# Patient Record
Sex: Female | Born: 1990 | Race: Black or African American | Hispanic: No | Marital: Single | Smoking: Never smoker
Health system: Southern US, Community
[De-identification: ages and names within clinical notes are randomized; demographics above are authoritative.]

## PROBLEM LIST (undated history)

## (undated) DIAGNOSIS — E669 Obesity, unspecified: Secondary | ICD-10-CM

## (undated) DIAGNOSIS — K219 Gastro-esophageal reflux disease without esophagitis: Secondary | ICD-10-CM

## (undated) DIAGNOSIS — S43011A Anterior subluxation of right humerus, initial encounter: Secondary | ICD-10-CM

## (undated) DIAGNOSIS — I10 Essential (primary) hypertension: Secondary | ICD-10-CM

## (undated) HISTORY — DX: Gastro-esophageal reflux disease without esophagitis: K21.9

## (undated) HISTORY — DX: Anterior subluxation of right humerus, initial encounter: S43.011A

---
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
## 2012-06-14 ENCOUNTER — Ambulatory Visit (INDEPENDENT_AMBULATORY_CARE_PROVIDER_SITE_OTHER): Payer: Medicaid Other | Admitting: Family Medicine

## 2012-06-14 ENCOUNTER — Ambulatory Visit (HOSPITAL_COMMUNITY)
Admission: RE | Admit: 2012-06-14 | Discharge: 2012-06-14 | Disposition: A | Discharge status: Home / Self Care | Payer: Medicaid Other | Source: Ambulatory Visit | Attending: Family Medicine | Admitting: Family Medicine

## 2012-06-14 ENCOUNTER — Encounter: Payer: Self-pay | Admitting: Family Medicine

## 2012-06-14 VITALS — BP 149/83 | HR 102 | Temp 98.3°F | Ht 60.5 in | Wt 341.0 lb

## 2012-06-14 DIAGNOSIS — K219 Gastro-esophageal reflux disease without esophagitis: Secondary | ICD-10-CM

## 2012-06-14 DIAGNOSIS — R079 Chest pain, unspecified: Secondary | ICD-10-CM | POA: Insufficient documentation

## 2012-06-14 HISTORY — DX: Gastro-esophageal reflux disease without esophagitis: K21.9

## 2012-06-14 MED ORDER — OMEPRAZOLE 40 MG PO CPDR: 40.0000 mg | DELAYED_RELEASE_CAPSULE | Freq: Every day | ORAL | Status: DC

## 2012-06-14 NOTE — Progress Notes (Signed)
Paula Drake is a 22 y.o. female who presents to [facility] today for CP  CP: started one week ago. Feels like someone is standing on chest. Worse at night. Worse after doing lifting weights primarily butterflies w/ 10lb in each. Started lifting 2 weeks ago. Sleeps on Side. Denies n/v palpitaitons, HA, syncope. Has not taken anything for pain.   GERD: Trying pepcid and maalox w/o benefit. Cut out greasy foods w/o benefit. Typically after every meal and when laying down. Denies any hematemesis or melena.    The following portions of the patient's history were reviewed and updated as appropriate: allergies, current medications,  social history, and problem list.  Patient is a nonsmoker  Past Medical History  Diagnosis Date  . Obesity     ROS as above otherwise neg.    Medications reviewed. Current Outpatient Prescriptions  Medication Sig Dispense Refill  . alum & mag hydroxide-simeth (MAALOX/MYLANTA) 200-200-20 MG/5 SUSP 30 cc every 8 hours as needed  360 mL  0  . Aspirin-Salicylamide-Caffeine (BC HEADACHE PO) Take 1 packet by mouth daily as needed. For headache      . famotidine (PEPCID) 20 MG tablet Take 1 tablet (20 mg total) by mouth 2 (two) times daily.  30 tablet  0  . magnesium hydroxide (MILK OF MAGNESIA) 400 MG/5ML suspension Take 15 mLs by mouth every 4 (four) hours.       No current facility-administered medications for this visit.    Exam: BP 149/83  Pulse 102  Temp(Src) 98.3 F (36.8 C) (Oral)  Ht 5' 0.5" (1.537 m)  Wt 341 lb (154.677 kg)  BMI 65.48 kg/m2  LMP 06/01/2012 Gen: Well NAD, obese HEENT: EOMI,  MMM Lungs: CTABL Nl WOB Heart: RRR no MRG Abd: NABS, NT, ND MSK: reproducible superficial pain on palpation of chest wall  No results found for this or any previous visit (from the past 72 hour(s)).

## 2012-06-14 NOTE — Assessment & Plan Note (Addendum)
Most liekly MSK pain (costochondritis) likely compounded w/ GERD. Unlikely ischemic Ibuprofen 600mg  Q6 (this may worsen reflux so pt to use for the next couple of days and and discontinue if reflux worsens) Prilosec 40 Qday x14 days Stop butterfly exercises for 2 wks

## 2012-06-14 NOTE — Patient Instructions (Addendum)
Your chest pain is from costochondritis or inflammation of the bones and cartiledge of the chest/sternum.  Stop doing your butterfly exercises with the weights for at least 2 weeks Please start taking ibuprofen 600mg  every 6 hours as tolerated for the next several days. THis may make your heartburn worse but will help with your chest pain. If this upsets your stomach then stop Please start taking Prilosec 40mg  every day for 2 weeks. This will help with your heartburn.  Continue to work on cutting fried and spicy foods out of your diet.  Have a great day  Costochondritis Costochondritis (Tietze syndrome), or costochondral separation, is a swelling and irritation (inflammation) of the tissue (cartilage) that connects your ribs with your breastbone (sternum). It may occur on its own (spontaneously), through damage caused by an accident (trauma), or simply from coughing or minor exercise. It may take up to 6 weeks to get better and longer if you are unable to be conservative in your activities. HOME CARE INSTRUCTIONS   Avoid exhausting physical activity. Try not to strain your ribs during normal activity. This would include any activities using chest, belly (abdominal), and side muscles, especially if heavy weights are used.  Use ice for 15 to 20 minutes per hour while awake for the first 2 days. Place the ice in a plastic bag, and place a towel between the bag of ice and your skin.  Only take over-the-counter or prescription medicines for pain, discomfort, or fever as directed by your caregiver. SEEK IMMEDIATE MEDICAL CARE IF:   Your pain increases or you are very uncomfortable.  You have a fever.  You develop difficulty with your breathing.  You cough up blood.  You develop worse chest pains, shortness of breath, sweating, or vomiting.  You develop new, unexplained problems (symptoms). MAKE SURE YOU:   Understand these instructions.  Will watch your condition.  Will get help right  away if you are not doing well or get worse. Document Released: 01/22/2005 Document Revised: 07/07/2011 Document Reviewed: 12/01/2007 [facility] Patient Information 2013 [place], [place].   Gastroesophageal Reflux Disease, Adult Gastroesophageal reflux disease (GERD) happens when acid from your stomach flows up into the esophagus. When acid comes in contact with the esophagus, the acid causes soreness (inflammation) in the esophagus. Over time, GERD may create small holes (ulcers) in the lining of the esophagus. CAUSES   Increased body weight. This puts pressure on the stomach, making acid rise from the stomach into the esophagus.  Smoking. This increases acid production in the stomach.  Drinking alcohol. This causes decreased pressure in the lower esophageal sphincter (valve or ring of muscle between the esophagus and stomach), allowing acid from the stomach into the esophagus.  Late evening meals and a full stomach. This increases pressure and acid production in the stomach.  A malformed lower esophageal sphincter. Sometimes, no cause is found. SYMPTOMS   Burning pain in the lower part of the mid-chest behind the breastbone and in the mid-stomach area. This may occur twice a week or more often.  Trouble swallowing.  Sore throat.  Dry cough.  Asthma-like symptoms including chest tightness, shortness of breath, or wheezing. DIAGNOSIS  Your caregiver may be able to diagnose GERD based on your symptoms. In some cases, X-rays and other tests may be done to check for complications or to check the condition of your stomach and esophagus. TREATMENT  Your caregiver may recommend over-the-counter or prescription medicines to help decrease acid production. Ask your caregiver before starting or  adding any new medicines.  HOME CARE INSTRUCTIONS   Change the factors that you can control. Ask your caregiver for guidance concerning weight loss, quitting smoking, and alcohol consumption.  Avoid  foods and drinks that make your symptoms worse, such as:  Caffeine or alcoholic drinks.  Chocolate.  Peppermint or mint flavorings.  Garlic and onions.  Spicy foods.  Citrus fruits, such as oranges, lemons, or limes.  Tomato-based foods such as sauce, chili, salsa, and pizza.  Fried and fatty foods.  Avoid lying down for the 3 hours prior to your bedtime or prior to taking a nap.  Eat small, frequent meals instead of large meals.  Wear loose-fitting clothing. Do not wear anything tight around your waist that causes pressure on your stomach.  Raise the head of your bed 6 to 8 inches with wood blocks to help you sleep. Extra pillows will not help.  Only take over-the-counter or prescription medicines for pain, discomfort, or fever as directed by your caregiver.  Do not take aspirin, ibuprofen, or other nonsteroidal anti-inflammatory drugs (NSAIDs). SEEK IMMEDIATE MEDICAL CARE IF:   You have pain in your arms, neck, jaw, teeth, or back.  Your pain increases or changes in intensity or duration.  You develop nausea, vomiting, or sweating (diaphoresis).  You develop shortness of breath, or you faint.  Your vomit is green, yellow, black, or looks like coffee grounds or blood.  Your stool is red, bloody, or black. These symptoms could be signs of other problems, such as heart disease, gastric bleeding, or esophageal bleeding. MAKE SURE YOU:   Understand these instructions.  Will watch your condition.  Will get help right away if you are not doing well or get worse. Document Released: 01/22/2005 Document Revised: 07/07/2011 Document Reviewed: 11/01/2010 [facility] Patient Information 2013 [place], [place].

## 2012-06-14 NOTE — Assessment & Plan Note (Signed)
Reflux not well controlled by maalox and pepscid.  Start 2 wk trial of prilosec 40mg  qday

## 2012-06-15 ENCOUNTER — Encounter: Payer: Self-pay | Admitting: Family Medicine

## 2012-07-13 ENCOUNTER — Encounter: Payer: Medicaid Other | Admitting: Family Medicine

## 2012-08-02 ENCOUNTER — Encounter: Payer: Medicaid Other | Admitting: Family Medicine

## 2012-08-18 ENCOUNTER — Encounter: Payer: Medicaid Other | Admitting: Family Medicine

## 2013-04-21 ENCOUNTER — Emergency Department (HOSPITAL_COMMUNITY)
Admission: EM | Admit: 2013-04-21 | Discharge: 2013-04-21 | Disposition: A | Discharge status: Home / Self Care | Payer: Medicaid Other | Attending: Emergency Medicine | Admitting: Emergency Medicine

## 2013-04-21 ENCOUNTER — Encounter (HOSPITAL_COMMUNITY): Payer: Self-pay | Admitting: Emergency Medicine

## 2013-04-21 DIAGNOSIS — J020 Streptococcal pharyngitis: Secondary | ICD-10-CM

## 2013-04-21 DIAGNOSIS — E669 Obesity, unspecified: Secondary | ICD-10-CM | POA: Insufficient documentation

## 2013-04-21 MED ORDER — PENICILLIN G BENZATHINE 1200000 UNIT/2ML IM SUSP
1.2000 10*6.[IU] | Freq: Once | INTRAMUSCULAR | Status: AC
  Administered 2013-04-21: 1.2 10*6.[IU] via INTRAMUSCULAR
  Filled 2013-04-21: qty 2

## 2013-04-21 MED ORDER — DEXAMETHASONE SODIUM PHOSPHATE 10 MG/ML IJ SOLN
10.0000 mg | Freq: Once | INTRAMUSCULAR | Status: AC
  Administered 2013-04-21: 10 mg via INTRAMUSCULAR
  Filled 2013-04-21: qty 1

## 2013-04-21 NOTE — ED Notes (Signed)
Patient presents stating that she started with a sore throat 2 days ago, fever yesterday

## 2013-04-21 NOTE — ED Provider Notes (Signed)
Medical screening examination/treatment/procedure(s) were performed by non-physician practitioner and as supervising physician I was immediately available for consultation/collaboration.    Quentez Lober M Apryl Brymer, MD 04/21/13 2048 

## 2013-04-21 NOTE — ED Provider Notes (Signed)
CSN: [id number]     Arrival date & time 04/21/13  0509 History   First MD Initiated Contact with Patient 04/21/13 [phone]     Chief Complaint  Patient presents with  . Sore Throat   (Consider location/radiation/quality/duration/timing/severity/associated sxs/prior Treatment) HPI Comments: Patient here with sore throat for the past 2 days, she reports fever to 101 yesterday - states that her niece is sick as well - she denies headache, nausea, vomiting, states pain with swallowing but is able to keep down fluids.  She denies cough, congestion, chest pain, abdominal pain or shortness of breath.  Patient is a 22 y.o. female presenting with pharyngitis. The history is provided by the patient. No language interpreter was used.  Sore Throat This is a new problem. The current episode started yesterday. The problem occurs constantly. The problem has been unchanged. Associated symptoms include a fever, a sore throat and swollen glands. Pertinent negatives include no abdominal pain, arthralgias, chest pain, chills, congestion, coughing, headaches, myalgias, nausea, neck pain, numbness, rash, vertigo, vomiting or weakness. The symptoms are aggravated by swallowing. She has tried nothing for the symptoms. The treatment provided no relief.    Past Medical History  Diagnosis Date  . Obesity    History reviewed. No pertinent past surgical history. History reviewed. No pertinent family history. History  Substance Use Topics  . Smoking status: Never Smoker   . Smokeless tobacco: Not on file  . Alcohol Use: No   OB History   Grav Para Term Preterm Abortions TAB SAB Ect Mult Living                 Review of Systems  Constitutional: Positive for fever. Negative for chills.  HENT: Positive for sore throat. Negative for congestion.   Respiratory: Negative for cough.   Cardiovascular: Negative for chest pain.  Gastrointestinal: Negative for nausea, vomiting and abdominal pain.  Musculoskeletal: Negative  for arthralgias, myalgias and neck pain.  Skin: Negative for rash.  Neurological: Negative for vertigo, weakness, numbness and headaches.  All other systems reviewed and are negative.    Allergies  Review of patient's allergies indicates no known allergies.  Home Medications   Current Outpatient Rx  Name  Route  Sig  Dispense  Refill  . DM-Doxylamine-Acetaminophen (NYQUIL COLD & FLU PO)   Oral   Take 30 mLs by mouth at bedtime.         Marland Kitchen ibuprofen (ADVIL,MOTRIN) 200 MG tablet   Oral   Take 600 mg by mouth every 6 (six) hours as needed for mild pain or moderate pain (sore throat).         . EXPIRED: famotidine (PEPCID) 20 MG tablet   Oral   Take 1 tablet (20 mg total) by mouth 2 (two) times daily.   30 tablet   0    BP 144/99  Pulse 90  Temp(Src) 98.6 F (37 C) (Oral)  Resp 18  Ht 5\' 4"  (1.626 m)  Wt 358 lb 6.4 oz (162.569 kg)  BMI 61.49 kg/m2  SpO2 100%  LMP 04/13/2013 Physical Exam  Nursing note and vitals reviewed. Constitutional: She is oriented to person, place, and time. She appears well-developed and well-nourished. No distress.  HENT:  Head: Normocephalic and atraumatic.  Right Ear: External ear normal.  Left Ear: External ear normal.  Nose: Nose normal.  Mouth/Throat: Uvula is midline and mucous membranes are normal. Oropharyngeal exudate present. No posterior oropharyngeal edema, posterior oropharyngeal erythema or tonsillar abscesses.  Eyes: Conjunctivae are normal.  Pupils are equal, round, and reactive to light. No scleral icterus.  Neck: Normal range of motion. Neck supple.  Bilateral anterior cervical adenopathy  Cardiovascular: Normal rate, regular rhythm and normal heart sounds.  Exam reveals no gallop and no friction rub.   No murmur heard. Pulmonary/Chest: Effort normal and breath sounds normal. No respiratory distress. She has no wheezes. She has no rales. She exhibits no tenderness.  Abdominal: Soft. Bowel sounds are normal. She exhibits no  distension. There is no tenderness.  Musculoskeletal: Normal range of motion. She exhibits no edema and no tenderness.  Lymphadenopathy:    She has cervical adenopathy.  Neurological: She is alert and oriented to person, place, and time. She exhibits normal muscle tone. Coordination normal.  Skin: Skin is warm and dry. No rash noted. No erythema. No pallor.  Psychiatric: She has a normal mood and affect. Her behavior is normal. Judgment and thought content normal.    ED Course  Procedures (including critical care time) Labs Review Labs Reviewed  RAPID STREP SCREEN  CULTURE, GROUP A STREP   Imaging Review No results found.  EKG Interpretation   None      Results for orders placed during the hospital encounter of 04/21/13  RAPID STREP SCREEN      Result Value Range   Streptococcus, Group A Screen (Direct) NEGATIVE  NEGATIVE   No results found.    MDM  Strep pharyngitis  Patient here with 2 day history of sore throat, pain with swallowing.  Although strep is negative based on clinical examination I will be treating for this.  Patient has opted for Bicillin LA 1.2 million units IM and will also give decadron 10mg  IM.  No evidence of ludwig's angina, PTA.    Izola Price Marisue Humble, [place] 04/21/13 [phone]

## 2013-04-24 LAB — CULTURE, GROUP A STREP

## 2013-05-15 ENCOUNTER — Telehealth: Payer: Self-pay | Admitting: Family Medicine

## 2013-05-15 NOTE — Telephone Encounter (Signed)
Received call on the emergency line. Pt reports she was treated for strep throat on Christmas, and she has had tender lymph nodes and on-and-off fever (has not checked). She is also complains of some ear stuffiness / pain. Ibuprofen helped some; pt states she has not felt completely better since she has been home. She is able to eat and drink well and has no chest pain or difficulty breathing. She has a doctor's appointment on 1/20 (Tuesday) at 3 PM with Dr. Armen PickupFunches. Overall, she "doesn't feel too bad" but is uncertain if she needs to be re-evaluated sooner or not and is uncomfortable with her current symptoms.  Recommended supportive treatment for now; suggested OTC meds for symptoms and to keep doctor's appointment for Tuesday. Advised pt if her symptoms worsen or progress, she should come to the ED to be evaluated or try to come into clinic earlier than Tuesday. Pt voiced understanding. Willl route this note to PCP and Dr. Armen PickupFunches.  Bobbye Mortonhristopher M Netanya Yazdani, MD PGY-2, [facility] 05/15/2013, 8:28 PM

## 2013-05-17 ENCOUNTER — Encounter: Payer: Self-pay | Admitting: Family Medicine

## 2013-05-17 ENCOUNTER — Ambulatory Visit (INDEPENDENT_AMBULATORY_CARE_PROVIDER_SITE_OTHER): Payer: Medicaid Other | Admitting: Family Medicine

## 2013-05-17 VITALS — BP 128/80 | HR 111 | Temp 98.0°F | Wt 353.0 lb

## 2013-05-17 DIAGNOSIS — K219 Gastro-esophageal reflux disease without esophagitis: Secondary | ICD-10-CM

## 2013-05-17 DIAGNOSIS — Z309 Encounter for contraceptive management, unspecified: Secondary | ICD-10-CM

## 2013-05-17 DIAGNOSIS — Z23 Encounter for immunization: Secondary | ICD-10-CM

## 2013-05-17 DIAGNOSIS — R030 Elevated blood-pressure reading, without diagnosis of hypertension: Secondary | ICD-10-CM

## 2013-05-17 DIAGNOSIS — L708 Other acne: Secondary | ICD-10-CM

## 2013-05-17 DIAGNOSIS — IMO0001 Reserved for inherently not codable concepts without codable children: Secondary | ICD-10-CM | POA: Insufficient documentation

## 2013-05-17 DIAGNOSIS — R079 Chest pain, unspecified: Secondary | ICD-10-CM

## 2013-05-17 DIAGNOSIS — J020 Streptococcal pharyngitis: Secondary | ICD-10-CM | POA: Insufficient documentation

## 2013-05-17 MED ORDER — FLUCONAZOLE 150 MG PO TABS: 150.0000 mg | ORAL_TABLET | Freq: Once | ORAL | Status: DC

## 2013-05-17 MED ORDER — AMOXICILLIN-POT CLAVULANATE 500-125 MG PO TABS: 1.0000 | ORAL_TABLET | Freq: Three times a day (TID) | ORAL | Status: AC

## 2013-05-17 NOTE — Assessment & Plan Note (Signed)
No symptoms. No longer requiring PPI  Will resolve

## 2013-05-17 NOTE — Patient Instructions (Signed)
Ms. Paula Drake,  Thank you for coming in today.  For your persistent swollen glands: Augmentin three times daily for 10 days  Followed by diflucan to prevent a vaginal yeast infection.   Congrats on weight loss! Keep up the good work. Repeat BP check is normal. Wt Readings from Last 3 Encounters:  05/17/13 353 lb (160.12 kg)  04/21/13 358 lb 6.4 oz (162.569 kg)  06/14/12 341 lb (154.677 kg)   For birth control: Schedule nexplanon placement. You can schedule this with me at your earliest convenience.   Dr. Armen PickupFunches

## 2013-05-17 NOTE — Assessment & Plan Note (Signed)
A: discussed options in detail. Patient leaning towards long term contraception. P:  patient to f/u with me for nexplanon placement. We discussed the procedure including risk and benefits. We discussed risk of spotting. We discussed the importance of condoms for STD and STI prevention.

## 2013-05-17 NOTE — Progress Notes (Signed)
   Subjective:    Patient ID: Paula Drake, female    DOB: 07/06/1990, 23 y.o.   MRN: [id number]  HPI 23 yo F presents for physical and to discuss the following:  1. Contraception: patient plan to have sex when she turns 23. Has boyfriend and feels ready. Has not yet been sexually active. [facility]. Leaning towards pill. Plans to start family around age 23. Currently in school studying nursing art [employer].   2. Persistent submental tenderness: persistent tenderness despite being treated for non-Group A strep with bicillin on 04/21/13. No sore throat. No fever. Ears popping. Tenderness improved with ibuprofen.  3. Obesity: exercising and on a low calorie diet with her mom. Actively trying to lose weight. Goal is to lose 50 lbs. Maintain weight loss then set a new goal. Exercise routine includes walking. Denies CP, SOB with exertion and rest.    HM: due for flu shot. Shot given today.   Review of Systems  Constitutional: Negative.   HENT: Negative.   Respiratory: Negative.   Cardiovascular: Negative.   Gastrointestinal: Negative.   Neurological: Negative.    As per HPI     Objective:   Physical Exam BP 128/80  Pulse 111  Temp(Src) 98 F (36.7 C) (Oral)  Wt 353 lb (160.12 kg)  LMP 04/13/2013 General appearance: alert, cooperative, no distress and morbidly obese Head: Normocephalic, without obvious abnormality, atraumatic Eyes: conjunctivae/corneas clear. PERRL, EOM's intact.  Ears: normal TM's and external ear canals both ears Nose: Nares normal. Septum midline. Mucosa normal. No drainage or sinus tenderness. Throat: normal findings: lips normal without lesions, buccal mucosa normal, gums healthy, teeth intact, non-carious, tongue midline and normal and oropharynx pink & moist without lesions or evidence of thrush and abnormal findings: tonsillar hypertrophy 3+ with scattered exudate  Neck: mild anterior cervical adenopathy, no carotid bruit, no JVD, supple,  symmetrical, trachea midline and thyroid not enlarged, symmetric, no tenderness/mass/nodules Lungs: clear to auscultation bilaterally Heart: regular rate and rhythm, S1, S2 normal, no murmur, click, rub or gallop Abdomen:Obese,  soft, non-tender; bowel sounds normal; no masses,  no organomegaly Extremities: extremities normal, atraumatic, no cyanosis or edema Skin: Skin color, texture, turgor normal. No rashes or lesions Neurologic: Grossly normal       Assessment & Plan:

## 2013-05-17 NOTE — Assessment & Plan Note (Signed)
A: improved. Down 5# since last month with diet and exercise P: Encouraged and congratulated progress

## 2013-05-17 NOTE — Assessment & Plan Note (Signed)
A: no recurrent CP P: resolve problem

## 2013-05-17 NOTE — Assessment & Plan Note (Signed)
A: s/p treatment with bicillin. Persistent submental adenopathy, tonsillar hypertrophy and exudate. P:  Retreat with augmentin x 10 days followed by diflucan to prevent vaginal yeast infection.

## 2013-05-17 NOTE — Assessment & Plan Note (Signed)
A: improved. No longer a concern. P: will resolve problem.

## 2013-05-17 NOTE — Assessment & Plan Note (Signed)
A: elevated in the past initially elevated at start of OV to 149/92. Repeat normal. P: Weight loss F/u at next OV for nexplanon placement. If elevated consider thiazide diuretic.

## 2013-06-08 ENCOUNTER — Telehealth: Payer: Self-pay | Admitting: Family Medicine

## 2013-06-08 NOTE — Telephone Encounter (Signed)
Appt scheduled for 06/13/2013 per pt request for chest congestion.  Pt stated that she is still having symptoms from last office visit.  Offered pt appt for tomorrow but declined.  Pt stated she did complete all antibiotics and tried over the medication with no relief.  Clovis PuMartin, Laurann Mcmorris L, RN

## 2013-06-08 NOTE — Telephone Encounter (Signed)
Pt called and would like to speak to a nurse. jw °

## 2013-06-13 ENCOUNTER — Ambulatory Visit: Payer: Medicaid Other | Admitting: Family Medicine

## 2013-11-04 ENCOUNTER — Emergency Department (HOSPITAL_COMMUNITY)
Admission: EM | Admit: 2013-11-04 | Discharge: 2013-11-04 | Disposition: A | Discharge status: Home / Self Care | Payer: Medicaid Other | Attending: Emergency Medicine | Admitting: Emergency Medicine

## 2013-11-04 ENCOUNTER — Encounter (HOSPITAL_COMMUNITY): Payer: Self-pay | Admitting: Emergency Medicine

## 2013-11-04 DIAGNOSIS — R112 Nausea with vomiting, unspecified: Secondary | ICD-10-CM | POA: Insufficient documentation

## 2013-11-04 DIAGNOSIS — R197 Diarrhea, unspecified: Secondary | ICD-10-CM | POA: Insufficient documentation

## 2013-11-04 DIAGNOSIS — Z8719 Personal history of other diseases of the digestive system: Secondary | ICD-10-CM | POA: Insufficient documentation

## 2013-11-04 DIAGNOSIS — R109 Unspecified abdominal pain: Secondary | ICD-10-CM

## 2013-11-04 DIAGNOSIS — Z3202 Encounter for pregnancy test, result negative: Secondary | ICD-10-CM | POA: Insufficient documentation

## 2013-11-04 DIAGNOSIS — Z87828 Personal history of other (healed) physical injury and trauma: Secondary | ICD-10-CM | POA: Insufficient documentation

## 2013-11-04 DIAGNOSIS — E669 Obesity, unspecified: Secondary | ICD-10-CM | POA: Insufficient documentation

## 2013-11-04 DIAGNOSIS — D649 Anemia, unspecified: Secondary | ICD-10-CM | POA: Insufficient documentation

## 2013-11-04 LAB — CBC WITH DIFFERENTIAL/PLATELET
BASOS PCT: 0 % (ref 0–1)
Basophils Absolute: 0 10*3/uL (ref 0.0–0.1)
Eosinophils Absolute: 0 10*3/uL (ref 0.0–0.7)
Eosinophils Relative: 0 % (ref 0–5)
HCT: 31.5 % — ABNORMAL LOW (ref 36.0–46.0)
HEMOGLOBIN: 9.6 g/dL — AB (ref 12.0–15.0)
Lymphocytes Relative: 16 % (ref 12–46)
Lymphs Abs: 1.8 10*3/uL (ref 0.7–4.0)
MCH: 22.4 pg — ABNORMAL LOW (ref 26.0–34.0)
MCHC: 30.5 g/dL (ref 30.0–36.0)
MCV: 73.6 fL — ABNORMAL LOW (ref 78.0–100.0)
MONO ABS: 1 10*3/uL (ref 0.1–1.0)
Monocytes Relative: 9 % (ref 3–12)
NEUTROS PCT: 75 % (ref 43–77)
Neutro Abs: 8.6 10*3/uL — ABNORMAL HIGH (ref 1.7–7.7)
Platelets: 364 10*3/uL (ref 150–400)
RBC: 4.28 MIL/uL (ref 3.87–5.11)
RDW: 15.9 % — AB (ref 11.5–15.5)
WBC: 11.4 10*3/uL — ABNORMAL HIGH (ref 4.0–10.5)

## 2013-11-04 LAB — COMPREHENSIVE METABOLIC PANEL
ALT: 7 U/L (ref 0–35)
AST: 16 U/L (ref 0–37)
Albumin: 3.6 g/dL (ref 3.5–5.2)
Alkaline Phosphatase: 76 U/L (ref 39–117)
Anion gap: 17 — ABNORMAL HIGH (ref 5–15)
BILIRUBIN TOTAL: 0.2 mg/dL — AB (ref 0.3–1.2)
BUN: 9 mg/dL (ref 6–23)
CO2: 22 mEq/L (ref 19–32)
Calcium: 9.1 mg/dL (ref 8.4–10.5)
Chloride: 100 mEq/L (ref 96–112)
Creatinine, Ser: 0.72 mg/dL (ref 0.50–1.10)
GFR calc Af Amer: >90 mL/min (ref >90)
GLUCOSE: 127 mg/dL — AB (ref 70–99)
Potassium: 4.2 mEq/L (ref 3.7–5.3)
Sodium: 139 mEq/L (ref 137–147)
Total Protein: 8 g/dL (ref 6.0–8.3)

## 2013-11-04 LAB — URINE MICROSCOPIC-ADD ON

## 2013-11-04 LAB — URINALYSIS, ROUTINE W REFLEX MICROSCOPIC
Glucose, UA: NEGATIVE mg/dL
Ketones, ur: 15 mg/dL — AB
Nitrite: NEGATIVE
PH: 5.5 (ref 5.0–8.0)
Protein, ur: 30 mg/dL — AB
SPECIFIC GRAVITY, URINE: 1.022 (ref 1.005–1.030)
Urobilinogen, UA: 1 mg/dL (ref 0.0–1.0)

## 2013-11-04 LAB — POC URINE PREG, ED: Preg Test, Ur: NEGATIVE

## 2013-11-04 LAB — LIPASE, BLOOD: Lipase: 29 U/L (ref 11–59)

## 2013-11-04 MED ORDER — FENTANYL CITRATE 0.05 MG/ML IJ SOLN
INTRAMUSCULAR | Status: DC
Start: 2013-11-04 — End: 2013-11-04
  Filled 2013-11-04: qty 2

## 2013-11-04 MED ORDER — SODIUM CHLORIDE 0.9 % IV BOLUS (SEPSIS)
1000.0000 mL | Freq: Once | INTRAVENOUS | Status: AC
  Administered 2013-11-04: 1000 mL via INTRAVENOUS

## 2013-11-04 MED ORDER — FENTANYL CITRATE 0.05 MG/ML IJ SOLN
50.0000 ug | Freq: Once | INTRAMUSCULAR | Status: AC
  Administered 2013-11-04: 50 ug via NASAL

## 2013-11-04 MED ORDER — ONDANSETRON 4 MG PO TBDP
8.0000 mg | ORAL_TABLET | Freq: Once | ORAL | Status: AC
  Administered 2013-11-04: 8 mg via ORAL
  Filled 2013-11-04: qty 2

## 2013-11-04 MED ORDER — MORPHINE SULFATE 4 MG/ML IJ SOLN
4.0000 mg | Freq: Once | INTRAMUSCULAR | Status: AC
  Administered 2013-11-04: 4 mg via INTRAVENOUS
  Filled 2013-11-04: qty 1

## 2013-11-04 NOTE — ED Notes (Signed)
Pt is here with mid abdominal pain that started last nite after eating out and now with vomiting and diarrhea

## 2013-11-04 NOTE — ED Provider Notes (Signed)
CSN: [id number]     Arrival date & time 11/04/13  1053 History   First MD Initiated Contact with Patient 11/04/13 1300     Chief Complaint  Patient presents with  . Abdominal Pain   HPI  Paula Drake is a 23 y.o. female with a PMH of GERD who presents to the ED for evaluation of abdominal pain. History was provided by the patient. Patient states that last night she went out to eat with her sister. Last night they both developed abdominal pain, nausea, vomiting and diarrhea. She complains of periumbilical abdominal pain without radiation. Describes this as an intermittent pain which occurs before diarrhea. She denies abdominal pain currently. After bowel movements her pain resolves. She did not take anything for pain PTA. She states she has had similar abdominal pain in the past, but "this is worse." No previous abdominal surgeries in the past. Denies any dysuria, hematuria, flank pain, back pain, vaginal discharge, hematochezia, hematemesis, fever, chills or other concerns. Is currently on her menstrual cycle.     Past Medical History  Diagnosis Date  . Obesity   . Esophageal reflux 06/14/2012  . 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  There is anterior subluxation of the right humeral head from right glenohumeral joint.  IMPRESSION: Anterior subluxation of the right humeral head.    History reviewed. No pertinent past surgical history. Family History  Problem Relation Age of Onset  . Cancer Father 28    lung cancer, smoker   . Diabetes Neg Hx   . Hypertension Neg Hx    History  Substance Use Topics  . Smoking status: Never Smoker   . Smokeless tobacco: Never Used  . Alcohol Use: No   OB History   Grav Para Term Preterm Abortions TAB SAB Ect Mult Living                 Review of Systems  Constitutional:  Negative for fever, chills, activity change, appetite change and fatigue.  Respiratory: Negative for cough and shortness of breath.   Cardiovascular: Negative for chest pain and leg swelling.  Gastrointestinal: Positive for nausea, vomiting, abdominal pain and diarrhea. Negative for constipation and blood in stool.  Genitourinary: Negative for dysuria and difficulty urinating.  Musculoskeletal: Negative for back pain and myalgias.  Neurological: Negative for dizziness, weakness, light-headedness and headaches.    Allergies  Review of patient's allergies indicates no known allergies.  Home Medications   Prior to Admission medications   Not on File   BP 150/74  Pulse 105  Temp(Src) 98.3 F (36.8 C) (Oral)  Resp 16  SpO2 100%  LMP 10/31/2013  Filed Vitals:   11/04/13 1303 11/04/13 1315 11/04/13 1413 11/04/13 1500  BP: 150/74 141/63 111/49 119/67  Pulse: 105 89 85 88  Temp:      TempSrc:      Resp: 16  16   SpO2: 100% 100% 100% 100%    Physical Exam  Nursing note and vitals reviewed. Constitutional: She is oriented to person, place, and time. She appears well-developed and well-nourished. No distress.  HENT:  Head: Normocephalic and atraumatic.  Right Ear: External ear normal.  Left Ear: External ear normal.  Mouth/Throat: Oropharynx is clear and moist.  Eyes: Conjunctivae are normal. Right eye exhibits no discharge. Left eye exhibits no  discharge.  Neck: Normal range of motion. Neck supple.  Cardiovascular: Normal rate, regular rhythm and normal heart sounds.  Exam reveals no gallop and no friction rub.   No murmur heard. Pulmonary/Chest: Effort normal and breath sounds normal. No respiratory distress. She has no wheezes. She has no rales. She exhibits no tenderness.  Abdominal: Soft. She exhibits no distension. There is no tenderness. There is no rebound and no guarding.  Musculoskeletal: Normal range of motion. She exhibits no edema and no tenderness.  No CVA, lumbar,  or flank tenderness bilaterally.   Neurological: She is alert and oriented to person, place, and time.  Skin: Skin is warm and dry. She is not diaphoretic.    ED Course  Procedures (including critical care time) Labs Review Labs Reviewed  CBC WITH DIFFERENTIAL - Abnormal; Notable for the following:    WBC 11.4 (*)    Hemoglobin 9.6 (*)    HCT 31.5 (*)    MCV 73.6 (*)    MCH 22.4 (*)    RDW 15.9 (*)    Neutro Abs 8.6 (*)    All other components within normal limits  COMPREHENSIVE METABOLIC PANEL - Abnormal; Notable for the following:    Glucose, Bld 127 (*)    Total Bilirubin 0.2 (*)    Anion gap 17 (*)    All other components within normal limits  URINALYSIS, ROUTINE W REFLEX MICROSCOPIC - Abnormal; Notable for the following:    Color, Urine RED (*)    APPearance TURBID (*)    Hgb urine dipstick LARGE (*)    Bilirubin Urine SMALL (*)    Ketones, ur 15 (*)    Protein, ur 30 (*)    Leukocytes, UA SMALL (*)    All other components within normal limits  URINE MICROSCOPIC-ADD ON - Abnormal; Notable for the following:    Squamous Epithelial / LPF FEW (*)    Bacteria, UA FEW (*)    All other components within normal limits  LIPASE, BLOOD  POC URINE PREG, ED    Imaging Review No results found.   EKG Interpretation None      Results for orders placed during the hospital encounter of 11/04/13  CBC WITH DIFFERENTIAL      Result Value Ref Range   WBC 11.4 (*) 4.0 - 10.5 K/uL   RBC 4.28  3.87 - 5.11 MIL/uL   Hemoglobin 9.6 (*) 12.0 - 15.0 g/dL   HCT 82.931.5 (*) 56.236.0 - 13.046.0 %   MCV 73.6 (*) 78.0 - 100.0 fL   MCH 22.4 (*) 26.0 - 34.0 pg   MCHC 30.5  30.0 - 36.0 g/dL   RDW 86.515.9 (*) 78.411.5 - 69.615.5 %   Platelets 364  150 - 400 K/uL   Neutrophils Relative % 75  43 - 77 %   Lymphocytes Relative 16  12 - 46 %   Monocytes Relative 9  3 - 12 %   Eosinophils Relative 0  0 - 5 %   Basophils Relative 0  0 - 1 %   Neutro Abs 8.6 (*) 1.7 - 7.7 K/uL   Lymphs Abs 1.8  0.7 - 4.0 K/uL    Monocytes Absolute 1.0  0.1 - 1.0 K/uL   Eosinophils Absolute 0.0  0.0 - 0.7 K/uL   Basophils Absolute 0.0  0.0 - 0.1 K/uL   RBC Morphology POLYCHROMASIA PRESENT    COMPREHENSIVE METABOLIC PANEL      Result Value Ref Range   Sodium 139  137 -  147 mEq/L   Potassium 4.2  3.7 - 5.3 mEq/L   Chloride 100  96 - 112 mEq/L   CO2 22  19 - 32 mEq/L   Glucose, Bld 127 (*) 70 - 99 mg/dL   BUN 9  6 - 23 mg/dL   Creatinine, Ser 1.61  0.50 - 1.10 mg/dL   Calcium 9.1  8.4 - 09.6 mg/dL   Total Protein 8.0  6.0 - 8.3 g/dL   Albumin 3.6  3.5 - 5.2 g/dL   AST 16  0 - 37 U/L   ALT 7  0 - 35 U/L   Alkaline Phosphatase 76  39 - 117 U/L   Total Bilirubin 0.2 (*) 0.3 - 1.2 mg/dL   GFR calc non Af Amer >90  >90 mL/min   GFR calc Af Amer >90  >90 mL/min   Anion gap 17 (*) 5 - 15  URINALYSIS, ROUTINE W REFLEX MICROSCOPIC      Result Value Ref Range   Color, Urine RED (*) YELLOW   APPearance TURBID (*) CLEAR   Specific Gravity, Urine 1.022  1.005 - 1.030   pH 5.5  5.0 - 8.0   Glucose, UA NEGATIVE  NEGATIVE mg/dL   Hgb urine dipstick LARGE (*) NEGATIVE   Bilirubin Urine SMALL (*) NEGATIVE   Ketones, ur 15 (*) NEGATIVE mg/dL   Protein, ur 30 (*) NEGATIVE mg/dL   Urobilinogen, UA 1.0  0.0 - 1.0 mg/dL   Nitrite NEGATIVE  NEGATIVE   Leukocytes, UA SMALL (*) NEGATIVE  LIPASE, BLOOD      Result Value Ref Range   Lipase 29  11 - 59 U/L  URINE MICROSCOPIC-ADD ON      Result Value Ref Range   Squamous Epithelial / LPF FEW (*) RARE   WBC, UA 0-2  <3 WBC/hpf   RBC / HPF TOO NUMEROUS TO COUNT  <3 RBC/hpf   Bacteria, UA FEW (*) RARE   Urine-Other LESS THAN 10 mL OF URINE SUBMITTED    POC URINE PREG, ED      Result Value Ref Range   Preg Test, Ur NEGATIVE  NEGATIVE     MDM   Paula Drake is a 23 y.o. female with a PMH of GERD who presents to the ED for evaluation of abdominal pain. Abdominal pain, vomiting and diarrhea possibly due to gastroenteritis. Patient's sister also sick with similar symptoms  after eating at the same restaurant. Abdominal exam benign. Patient's pain resolved throughout her ED visit. Patient's urine shows mild dehydration. Hematuria likely due to menstrual cycle. Mild leukocytosis (11.4) and anemia (9.6 and 31.5). Patient also had an elevated anion gap 17 however her electrolytes are WNL and glucose is 127. No DKA. Patient received a liter of IV fluids and was instructed to drink fluids and rest. Able to tolerate PO challenge. Return precautions, discharge instructions, and follow-up was discussed with the patient before discharge.    Rechecks  3:15 PM = Pain returned. Ordering 4 mg morphine.  4:30 PM = Pain resolved. Repeat abdominal exam benign with no tenderness. States she is ready for discharge. Able to tolerate PO challenge.    There are no discharge medications for this patient.   Final impressions: 1. Abdominal pain, unspecified abdominal location   2. Nausea vomiting and diarrhea   3. Anemia, unspecified anemia type       Luiz Iron PA-C           Jillyn Ledger, PA-C 11/05/13 1407

## 2013-11-04 NOTE — ED Notes (Signed)
PA back at bedside for reassessment 

## 2013-11-04 NOTE — Discharge Instructions (Signed)
Drink fluids and rest  Bland diet for 24-48 hours  Return to the emergency department if you develop any changing/worsening condition, repeated vomiting, pain that migrates to the RLQ, fever, or any other concerns (please read additional information regarding your condition below)   Abdominal Pain Many things can cause abdominal pain. Usually, abdominal pain is not caused by a disease and will improve without treatment. It can often be observed and treated at home. Your health care provider will do a physical exam and possibly order blood tests and X-rays to help determine the seriousness of your pain. However, in many cases, more time must pass before a clear cause of the pain can be found. Before that point, your health care provider may not know if you need more testing or further treatment. HOME CARE INSTRUCTIONS  Monitor your abdominal pain for any changes. The following actions may help to alleviate any discomfort you are experiencing:  Only take over-the-counter or prescription medicines as directed by your health care provider.  Do not take laxatives unless directed to do so by your health care provider.  Try a clear liquid diet (broth, tea, or water) as directed by your health care provider. Slowly move to a bland diet as tolerated. SEEK MEDICAL CARE IF:  You have unexplained abdominal pain.  You have abdominal pain associated with nausea or diarrhea.  You have pain when you urinate or have a bowel movement.  You experience abdominal pain that wakes you in the night.  You have abdominal pain that is worsened or improved by eating food.  You have abdominal pain that is worsened with eating fatty foods.  You have a fever. SEEK IMMEDIATE MEDICAL CARE IF:   Your pain does not go away within 2 hours.  You keep throwing up (vomiting).  Your pain is felt only in portions of the abdomen, such as the right side or the left lower portion of the abdomen.  You pass bloody or black  tarry stools. MAKE SURE YOU:  Understand these instructions.   Will watch your condition.   Will get help right away if you are not doing well or get worse.  Document Released: 01/22/2005 Document Revised: 04/19/2013 Document Reviewed: 12/22/2012 [facility] Patient Information 2015 [place], [place]. This information is not intended to replace advice given to you by your health care provider. Make sure you discuss any questions you have with your health care provider.  Nausea and Vomiting Nausea is a sick feeling that often comes before throwing up (vomiting). Vomiting is a reflex where stomach contents come out of your mouth. Vomiting can cause severe loss of body fluids (dehydration). Children and elderly adults can become dehydrated quickly, especially if they also have diarrhea. Nausea and vomiting are symptoms of a condition or disease. It is important to find the cause of your symptoms. CAUSES   Direct irritation of the stomach lining. This irritation can result from increased acid production (gastroesophageal reflux disease), infection, food poisoning, taking certain medicines (such as nonsteroidal anti-inflammatory drugs), alcohol use, or tobacco use.  Signals from the brain.These signals could be caused by a headache, heat exposure, an inner ear disturbance, increased pressure in the brain from injury, infection, a tumor, or a concussion, pain, emotional stimulus, or metabolic problems.  An obstruction in the gastrointestinal tract (bowel obstruction).  Illnesses such as diabetes, hepatitis, gallbladder problems, appendicitis, kidney problems, cancer, sepsis, atypical symptoms of a heart attack, or eating disorders.  Medical treatments such as chemotherapy and radiation.  Receiving  medicine that makes you sleep (general anesthetic) during surgery. DIAGNOSIS Your caregiver may ask for tests to be done if the problems do not improve after a few days. Tests may also be done if  symptoms are severe or if the reason for the nausea and vomiting is not clear. Tests may include:  Urine tests.  Blood tests.  Stool tests.  Cultures (to look for evidence of infection).  X-rays or other imaging studies. Test results can help your caregiver make decisions about treatment or the need for additional tests. TREATMENT You need to stay well hydrated. Drink frequently but in small amounts.You may wish to drink water, sports drinks, clear broth, or eat frozen ice pops or gelatin dessert to help stay hydrated.When you eat, eating slowly may help prevent nausea.There are also some antinausea medicines that may help prevent nausea. HOME CARE INSTRUCTIONS   Take all medicine as directed by your caregiver.  If you do not have an appetite, do not force yourself to eat. However, you must continue to drink fluids.  If you have an appetite, eat a normal diet unless your caregiver tells you differently.  Eat a variety of complex carbohydrates (rice, wheat, potatoes, bread), lean meats, yogurt, fruits, and vegetables.  Avoid high-fat foods because they are more difficult to digest.  Drink enough water and fluids to keep your urine clear or pale yellow.  If you are dehydrated, ask your caregiver for specific rehydration instructions. Signs of dehydration may include:  Severe thirst.  Dry lips and mouth.  Dizziness.  Dark urine.  Decreasing urine frequency and amount.  Confusion.  Rapid breathing or pulse. SEEK IMMEDIATE MEDICAL CARE IF:   You have blood or brown flecks (like coffee grounds) in your vomit.  You have black or bloody stools.  You have a severe headache or stiff neck.  You are confused.  You have severe abdominal pain.  You have chest pain or trouble breathing.  You do not urinate at least once every 8 hours.  You develop cold or clammy skin.  You continue to vomit for longer than 24 to 48 hours.  You have a fever. MAKE SURE YOU:    Understand these instructions.  Will watch your condition.  Will get help right away if you are not doing well or get worse. Document Released: 04/14/2005 Document Revised: 07/07/2011 Document Reviewed: 09/11/2010 [facility] Patient Information 2015 [place], [place]. This information is not intended to replace advice given to you by your health care provider. Make sure you discuss any questions you have with your health care provider.

## 2013-11-04 NOTE — ED Notes (Signed)
Pt knows that urine is needed, pt stated that she does not have to void at this time.

## 2013-11-05 NOTE — ED Provider Notes (Signed)
Medical screening examination/treatment/procedure(s) were performed by non-physician practitioner and as supervising physician I was immediately available for consultation/collaboration.   EKG Interpretation None       Geoffery Lyonsouglas Zarif Rathje, MD 11/05/13 1905

## 2014-01-09 ENCOUNTER — Emergency Department (HOSPITAL_COMMUNITY)
Admission: EM | Admit: 2014-01-09 | Discharge: 2014-01-09 | Disposition: A | Discharge status: Home / Self Care | Payer: Medicaid Other | Attending: Emergency Medicine | Admitting: Emergency Medicine

## 2014-01-09 ENCOUNTER — Encounter (HOSPITAL_COMMUNITY): Payer: Self-pay | Admitting: Emergency Medicine

## 2014-01-09 DIAGNOSIS — E669 Obesity, unspecified: Secondary | ICD-10-CM | POA: Insufficient documentation

## 2014-01-09 DIAGNOSIS — IMO0001 Reserved for inherently not codable concepts without codable children: Secondary | ICD-10-CM | POA: Insufficient documentation

## 2014-01-09 DIAGNOSIS — R509 Fever, unspecified: Secondary | ICD-10-CM | POA: Insufficient documentation

## 2014-01-09 DIAGNOSIS — R197 Diarrhea, unspecified: Secondary | ICD-10-CM | POA: Insufficient documentation

## 2014-01-09 DIAGNOSIS — J029 Acute pharyngitis, unspecified: Secondary | ICD-10-CM | POA: Insufficient documentation

## 2014-01-09 DIAGNOSIS — R52 Pain, unspecified: Secondary | ICD-10-CM

## 2014-01-09 DIAGNOSIS — Z8781 Personal history of (healed) traumatic fracture: Secondary | ICD-10-CM | POA: Insufficient documentation

## 2014-01-09 DIAGNOSIS — Z8719 Personal history of other diseases of the digestive system: Secondary | ICD-10-CM | POA: Insufficient documentation

## 2014-01-09 DIAGNOSIS — R110 Nausea: Secondary | ICD-10-CM

## 2014-01-09 DIAGNOSIS — R112 Nausea with vomiting, unspecified: Secondary | ICD-10-CM | POA: Insufficient documentation

## 2014-01-09 LAB — COMPREHENSIVE METABOLIC PANEL
ALT: 8 U/L (ref 0–35)
ANION GAP: 14 (ref 5–15)
AST: 15 U/L (ref 0–37)
Albumin: 3.4 g/dL — ABNORMAL LOW (ref 3.5–5.2)
Alkaline Phosphatase: 73 U/L (ref 39–117)
BILIRUBIN TOTAL: 0.2 mg/dL — AB (ref 0.3–1.2)
BUN: 11 mg/dL (ref 6–23)
CHLORIDE: 101 meq/L (ref 96–112)
CO2: 22 mEq/L (ref 19–32)
Calcium: 9.3 mg/dL (ref 8.4–10.5)
Creatinine, Ser: 0.77 mg/dL (ref 0.50–1.10)
GFR calc non Af Amer: >90 mL/min (ref >90)
GLUCOSE: 96 mg/dL (ref 70–99)
Potassium: 4.3 mEq/L (ref 3.7–5.3)
Sodium: 137 mEq/L (ref 137–147)
Total Protein: 7.8 g/dL (ref 6.0–8.3)

## 2014-01-09 LAB — CBC WITH DIFFERENTIAL/PLATELET
Basophils Absolute: 0 10*3/uL (ref 0.0–0.1)
Basophils Relative: 0 % (ref 0–1)
Eosinophils Absolute: 0.4 10*3/uL (ref 0.0–0.7)
Eosinophils Relative: 4 % (ref 0–5)
HEMATOCRIT: 28.2 % — AB (ref 36.0–46.0)
Hemoglobin: 7.9 g/dL — ABNORMAL LOW (ref 12.0–15.0)
Lymphocytes Relative: 24 % (ref 12–46)
Lymphs Abs: 2.2 10*3/uL (ref 0.7–4.0)
MCH: 18.6 pg — ABNORMAL LOW (ref 26.0–34.0)
MCHC: 28 g/dL — ABNORMAL LOW (ref 30.0–36.0)
MCV: 66.4 fL — AB (ref 78.0–100.0)
MONOS PCT: 9 % (ref 3–12)
Monocytes Absolute: 0.8 10*3/uL (ref 0.1–1.0)
NEUTROS ABS: 5.6 10*3/uL (ref 1.7–7.7)
Neutrophils Relative %: 63 % (ref 43–77)
Platelets: 375 10*3/uL (ref 150–400)
RBC: 4.25 MIL/uL (ref 3.87–5.11)
RDW: 18.3 % — ABNORMAL HIGH (ref 11.5–15.5)
WBC: 9 10*3/uL (ref 4.0–10.5)

## 2014-01-09 LAB — RAPID STREP SCREEN (MED CTR MEBANE ONLY): STREPTOCOCCUS, GROUP A SCREEN (DIRECT): NEGATIVE

## 2014-01-09 MED ORDER — ONDANSETRON HCL 4 MG PO TABS
4.0000 mg | ORAL_TABLET | Freq: Once | ORAL | Status: AC
  Administered 2014-01-09: 4 mg via ORAL
  Filled 2014-01-09: qty 1

## 2014-01-09 MED ORDER — SODIUM CHLORIDE 0.9 % IV BOLUS (SEPSIS)
1000.0000 mL | Freq: Once | INTRAVENOUS | Status: AC
  Administered 2014-01-09: 1000 mL via INTRAVENOUS

## 2014-01-09 MED ORDER — ONDANSETRON HCL 4 MG/2ML IJ SOLN
4.0000 mg | Freq: Once | INTRAMUSCULAR | Status: AC
  Administered 2014-01-09: 4 mg via INTRAVENOUS
  Filled 2014-01-09: qty 2

## 2014-01-09 MED ORDER — ONDANSETRON HCL 4 MG PO TABS: 4.0000 mg | ORAL_TABLET | Freq: Once | ORAL | Status: DC

## 2014-01-09 MED ORDER — ONDANSETRON HCL 4 MG PO TABS: 4.0000 mg | ORAL_TABLET | Freq: Four times a day (QID) | ORAL | Status: DC

## 2014-01-09 NOTE — ED Notes (Signed)
Pt from home for c/o sore throat x3 days and generalized body aches and fever at home. Afebrile currently. Pt also reports n/v/d x3 days, says she has been around a sick newborn. Denies any cp, cough or sob. nad noted. Axo x4

## 2014-01-09 NOTE — ED Notes (Signed)
Per pt sts sore throat, body aches and diarrhea. sts vomiting and cant keep anything down.

## 2014-01-09 NOTE — ED Notes (Signed)
Pt refusing urinalysis, states that she would like to leave and go home.

## 2014-01-09 NOTE — Discharge Instructions (Signed)
You were evaluated today for nausea, diarrhea and body aches. During her workup here there is no evidence to suggest an emergent reason for the symptoms are experiencing. Return to the ED for further evaluation if you began to experience worsening symptoms, he began to have bloody diarrhea, bloody vomit, fevers, or increased abdominal pain. You may followup with your PCP

## 2014-01-09 NOTE — ED Provider Notes (Signed)
CSN: [id number]     Arrival date & time 01/09/14  1143 History   First MD Initiated Contact with Patient 01/09/14 1644     Chief Complaint  Patient presents with  . Generalized Body Aches  . Diarrhea     (Consider location/radiation/quality/duration/timing/severity/associated sxs/prior Treatment) HPI Paula Drake is a 23 y.o. female with a pmh of GERD comes in for evaluation of N/V, sore throat, diarrhea since Thursday or Friday of last week. She has not taken any medication to treat her symptoms. Nothing makes it better or worse. She reports being around a sick baby last week. She reports fevers at home to 101.3. She denies any blood in her stool or vomit. No cough, abdominal pain, chest pain, difficulty breathing, numbness weakness, dysuria. She is currently on her period and using pads.  Past Medical History  Diagnosis Date  . Obesity   . Esophageal reflux 06/14/2012  . Anterior subluxation of right humerus 08/25/2010    G HUMERUS RIGHT - [id number] Clinical Data: Trauma post fall  RIGHT HUMERUS - 2+ VIEW  Comparison: 01/29/2009  Findings: Two views of the right humerus submitted.  There is anterior subluxation of the right humeral head from right glenohumeral joint.  IMPRESSION: Anterior subluxation of the right humeral head.    History reviewed. No pertinent past surgical history. Family History  Problem Relation Age of Onset  . Cancer Father 53    lung cancer, smoker   . Diabetes Neg Hx   . Hypertension Neg Hx    History  Substance Use Topics  . Smoking status: Never Smoker   . Smokeless tobacco: Never Used  . Alcohol Use: No   OB History   Grav Para Term Preterm Abortions TAB SAB Ect Mult Living                 Review of Systems  Constitutional: Positive for fever.  HENT: Positive for sore throat. Negative for congestion.   Eyes: Negative for visual disturbance.  Respiratory: Negative for shortness of breath.   Cardiovascular: Negative for chest pain.   Gastrointestinal: Positive for nausea and vomiting. Negative for abdominal pain and blood in stool.  Endocrine: Negative for polyuria.  Genitourinary: Negative for dysuria, frequency, flank pain and vaginal discharge.  Musculoskeletal: Positive for myalgias. Negative for back pain, neck pain and neck stiffness.  Skin: Negative for rash.  Neurological: Negative for headaches.      Allergies  Review of patient's allergies indicates no known allergies.  Home Medications   Prior to Admission medications   Medication Sig Start Date End Date Taking? Authorizing Provider  ondansetron (ZOFRAN) 4 MG tablet Take 1 tablet (4 mg total) by mouth every 6 (six) hours. 01/09/14   Earle Gell Briah Nary, PA-C   BP 124/51  Pulse 87  Temp(Src) 98.6 F (37 C) (Oral)  Resp 16  Ht  (1.575 m)  Wt 320 lb (145.151 kg)  BMI 58.51 kg/m2  SpO2 100%  LMP 01/09/2014 Physical Exam  Nursing note and vitals reviewed. Constitutional: She is oriented to person, place, and time. She appears well-developed and well-nourished.  HENT:  Head: Normocephalic and atraumatic.  Mild erythema to posterior pharynx, no exudate. No swelling appreciated. Handling secretions well. Patent airway  Eyes: Conjunctivae are normal. Pupils are equal, round, and reactive to light. Right eye exhibits no discharge. Left eye exhibits no discharge. No scleral icterus.  Neck: Neck supple. No JVD present.  Cardiovascular: Normal rate, regular rhythm and normal heart sounds.  Pulmonary/Chest: Effort normal and breath sounds normal. No respiratory distress. She has no wheezes. She has no rales.  Abdominal: Soft. She exhibits no distension and no mass. There is no tenderness. There is no rebound and no guarding.  Musculoskeletal: She exhibits no edema and no tenderness.  Patient able to ambulate independently around her room without any discomfort or ataxia  Neurological: She is alert and oriented to person, place, and time.  Cranial  Nerves II-XII grossly intact  Skin: Skin is warm and dry. No rash noted.  Psychiatric: She has a normal mood and affect.    ED Course  Procedures (including critical care time) Labs Review Labs Reviewed  CBC WITH DIFFERENTIAL - Abnormal; Notable for the following:    Hemoglobin 7.9 (*)    HCT 28.2 (*)    MCV 66.4 (*)    MCH 18.6 (*)    MCHC 28.0 (*)    RDW 18.3 (*)    All other components within normal limits  COMPREHENSIVE METABOLIC PANEL - Abnormal; Notable for the following:    Albumin 3.4 (*)    Total Bilirubin 0.2 (*)    All other components within normal limits  RAPID STREP SCREEN  CULTURE, GROUP A STREP    Imaging Review No results found.   EKG Interpretation None     Meds given in ED:  Medications  sodium chloride 0.9 % bolus 1,000 mL (0 mLs Intravenous Stopped 01/09/14 2001)  ondansetron (ZOFRAN) injection 4 mg (4 mg Intravenous Given 01/09/14 1821)  ondansetron (ZOFRAN) tablet 4 mg (4 mg Oral Given 01/09/14 2004)    Discharge Medication List as of 01/09/2014  8:35 PM    START taking these medications   Details  ondansetron (ZOFRAN) 4 MG tablet Take 1 tablet (4 mg total) by mouth every 6 (six) hours., Starting 01/09/2014, Until Discontinued, Print       Filed Vitals:   01/09/14 1715 01/09/14 1730 01/09/14 1745 01/09/14 1800  BP: 144/68 142/64 151/49 124/51  Pulse: 91  81 87  Temp:      TempSrc:      Resp: Height:      Weight:      SpO2: 100%  100% 100%    MDM  Vitals stable - WNL -afebrile Pt resting comfortably in ED. Received Zofran for Nausea and 1 LNS. Patient not vomiting or experiencing any diarrhea in emergency department PE and clinical picture not consistent with any emergent pathology. Symptomology likely due to viral etiology. Patient's fever at home resolved spontaneously without intervention Labwork showed low hemoglobin, but patient is currently on her menstrual cycle. She admits they are typically heavy. Labwork is  otherwise essentially normal, strep neg Will DC with Zofran for nausea, stress importance of proper hydration.  Patient states she feels better and wants to leave without providing urine sample. She says she will followup with her primary care doctor, Dr. Jacquelyne Balint, if she feels worse or does not begin to feel better. Tolerating PO Discussed f/u with PCP and return precautions, pt very amenable to plan.   Final diagnoses:  Nausea  Body aches  Diarrhea  Prior to patient discharge, I discussed and reviewed this case with Dr.Harrison         Sharlene Motts, PA-C 01/10/14 1040

## 2014-01-09 NOTE — ED Notes (Signed)
Pt given PO fluids.

## 2014-01-11 LAB — CULTURE, GROUP A STREP

## 2014-01-11 NOTE — ED Provider Notes (Signed)
Medical screening examination/treatment/procedure(s) were conducted as a shared visit with non-physician practitioner(s) and myself.  I personally evaluated the patient during the encounter.   EKG Interpretation None      I interviewed and examined the patient. Lungs are CTAB. Cardiac exam wnl. Abdomen soft.  Pt here w/ viral sx. Hgb incidentally found to be low. Pt notes long hx of irregular heavy periods and is currently on her period now. Denies any dark stools or blood in her stool. Wears pads and I deferred the rectal exam as her anemia is likely d/t the heavy periods and we may get a false positive hemeoccult given that she wears pads. She refused to give a urine sample as I did want to check a upreg. Regardless her sx are c/w a viral syndrome. Will recommend she f/u w/ pcp to follow her hgb and for further anemia workup.   Purvis Sheffield, MD 01/11/14 1204

## 2014-01-31 ENCOUNTER — Emergency Department (INDEPENDENT_AMBULATORY_CARE_PROVIDER_SITE_OTHER)
Admission: EM | Admit: 2014-01-31 | Discharge: 2014-01-31 | Disposition: A | Discharge status: Home / Self Care | Payer: Self-pay | Source: Home / Self Care

## 2014-01-31 ENCOUNTER — Encounter (HOSPITAL_COMMUNITY): Payer: Self-pay | Admitting: Emergency Medicine

## 2014-01-31 ENCOUNTER — Encounter (HOSPITAL_COMMUNITY): Payer: Self-pay | Admitting: *Deleted

## 2014-01-31 ENCOUNTER — Inpatient Hospital Stay (HOSPITAL_COMMUNITY)
Admission: AD | Admit: 2014-01-31 | Discharge: 2014-01-31 | Disposition: A | Discharge status: Home / Self Care | Payer: Medicaid Other | Source: Ambulatory Visit | Attending: Obstetrics & Gynecology | Admitting: Obstetrics & Gynecology

## 2014-01-31 DIAGNOSIS — D649 Anemia, unspecified: Secondary | ICD-10-CM | POA: Insufficient documentation

## 2014-01-31 DIAGNOSIS — N938 Other specified abnormal uterine and vaginal bleeding: Secondary | ICD-10-CM

## 2014-01-31 DIAGNOSIS — D62 Acute posthemorrhagic anemia: Secondary | ICD-10-CM

## 2014-01-31 DIAGNOSIS — N939 Abnormal uterine and vaginal bleeding, unspecified: Secondary | ICD-10-CM

## 2014-01-31 LAB — URINE MICROSCOPIC-ADD ON

## 2014-01-31 LAB — BASIC METABOLIC PANEL
Anion gap: 13 (ref 5–15)
BUN: 8 mg/dL (ref 6–23)
CALCIUM: 9.3 mg/dL (ref 8.4–10.5)
CO2: 23 meq/L (ref 19–32)
Chloride: 102 mEq/L (ref 96–112)
Creatinine, Ser: 0.77 mg/dL (ref 0.50–1.10)
GFR calc Af Amer: >90 mL/min (ref >90)
GLUCOSE: 87 mg/dL (ref 70–99)
POTASSIUM: 4 meq/L (ref 3.7–5.3)
Sodium: 138 mEq/L (ref 137–147)

## 2014-01-31 LAB — URINALYSIS, ROUTINE W REFLEX MICROSCOPIC
BILIRUBIN URINE: NEGATIVE
GLUCOSE, UA: NEGATIVE mg/dL
Ketones, ur: 15 mg/dL — AB
Leukocytes, UA: NEGATIVE
Nitrite: NEGATIVE
PH: 5.5 (ref 5.0–8.0)
Protein, ur: 100 mg/dL — AB
SPECIFIC GRAVITY, URINE: 1.025 (ref 1.005–1.030)
Urobilinogen, UA: 1 mg/dL (ref 0.0–1.0)

## 2014-01-31 LAB — CBC
HCT: 28.1 % — ABNORMAL LOW (ref 36.0–46.0)
HEMOGLOBIN: 7.9 g/dL — AB (ref 12.0–15.0)
MCH: 19 pg — ABNORMAL LOW (ref 26.0–34.0)
MCHC: 28.1 g/dL — ABNORMAL LOW (ref 30.0–36.0)
MCV: 67.5 fL — ABNORMAL LOW (ref 78.0–100.0)
PLATELETS: 338 10*3/uL (ref 150–400)
RBC: 4.16 MIL/uL (ref 3.87–5.11)
RDW: 22.7 % — ABNORMAL HIGH (ref 11.5–15.5)
WBC: 12.2 10*3/uL — ABNORMAL HIGH (ref 4.0–10.5)

## 2014-01-31 LAB — POCT PREGNANCY, URINE: PREG TEST UR: NEGATIVE

## 2014-01-31 MED ORDER — FERROUS SULFATE 325 (65 FE) MG PO TABS: 325.0000 mg | ORAL_TABLET | Freq: Every day | ORAL | Status: DC

## 2014-01-31 MED ORDER — MEGESTROL ACETATE 20 MG PO TABS: ORAL_TABLET | ORAL | Status: DC

## 2014-01-31 NOTE — ED Provider Notes (Signed)
CSN: [id number]     Arrival date & time 01/31/14  1224 History   None    Chief Complaint  Patient presents with  . Weakness   (Consider location/radiation/quality/duration/timing/severity/associated sxs/prior Treatment) Patient is a 23 y.o. female presenting with weakness. The history is provided by the patient.  Weakness This is a recurrent problem. The current episode started more than 1 week ago (10mo., dub  with similar sx prev but worse now.). The problem has been gradually worsening. Pertinent negatives include no abdominal pain.    Past Medical History  Diagnosis Date  . Obesity   . Esophageal reflux 06/14/2012  . 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  There is anterior subluxation of the right humeral head from right glenohumeral joint.  IMPRESSION: Anterior subluxation of the right humeral head.    History reviewed. No pertinent past surgical history. Family History  Problem Relation Age of Onset  . Cancer Father [id number]    lung cancer, smoker   . Diabetes Neg Hx   . Hypertension Neg Hx    History  Substance Use Topics  . Smoking status: Never Smoker   . Smokeless tobacco: Never Used  . Alcohol Use: Yes   OB History   Grav Para Term Preterm Abortions TAB SAB Ect Mult Living                 Review of Systems  Gastrointestinal: Negative.  Negative for abdominal pain.  Genitourinary: Positive for vaginal bleeding and menstrual problem.       3 tampons qd, daily constant bleeding.  Neurological: Positive for weakness.    Allergies  Review of patient's allergies indicates no known allergies.  Home Medications   Prior to Admission medications   Medication Sig Start Date End Date Taking? Authorizing Provider  ondansetron (ZOFRAN) 4 MG tablet Take 1 tablet (4 mg total) by mouth every 6 (six)  hours. 01/09/14   Earle GellBenjamin W Cartner, PA-C   BP 158/93  Pulse 106  Temp(Src) 99.4 F (37.4 C) (Oral)  Resp 20  Ht 5\' 1"  (1.549 m)  Wt 320 lb (145.151 kg)  BMI 60.49 kg/m2  SpO2 100%  LMP 01/09/2014 Physical Exam  Nursing note and vitals reviewed. Constitutional: She is oriented to person, place, and time. She appears well-developed and well-nourished. No distress.  Abdominal: Soft. Bowel sounds are normal. There is no tenderness.  Neurological: She is alert and oriented to person, place, and time.  Skin: Skin is warm and dry.    ED Course  Procedures (including critical care time) Labs Review Labs Reviewed - No data to display  Imaging Review No results found.   MDM   1. DUB (dysfunctional uterine bleeding)        Linna HoffJames D Tierre Netto, MD 01/31/14 1341

## 2014-01-31 NOTE — ED Notes (Signed)
Pt states that she has been feeling weak for over a month. Pt states she was seen in the ED were she was told she had low iron count and was given prescription for Iron. But pt states that she has not been taking them as prescribed

## 2014-01-31 NOTE — Discharge Instructions (Signed)
Call the GYN clinic tomorrow to schedule a follow up appointment. Continue to take your iron. Return here as needed for worsening symptoms.  Abnormal Uterine Bleeding Abnormal uterine bleeding can affect women at various stages in life, including teenagers, women in their reproductive years, pregnant women, and women who have reached menopause. Several kinds of uterine bleeding are considered abnormal, including:  Bleeding or spotting between periods.   Bleeding after sexual intercourse.   Bleeding that is heavier or more than normal.   Periods that last longer than usual.  Bleeding after menopause.  Many cases of abnormal uterine bleeding are minor and simple to treat, while others are more serious. Any type of abnormal bleeding should be evaluated by your health care provider. Treatment will depend on the cause of the bleeding. HOME CARE INSTRUCTIONS Monitor your condition for any changes. The following actions may help to alleviate any discomfort you are experiencing:  Avoid the use of tampons and douches as directed by your health care provider.  Change your pads frequently. You should get regular pelvic exams and Pap tests. Keep all follow-up appointments for diagnostic tests as directed by your health care provider.  SEEK MEDICAL CARE IF:   Your bleeding lasts more than 1 week.   You feel dizzy at times.  SEEK IMMEDIATE MEDICAL CARE IF:   You pass out.   You are changing pads every 15 to 30 minutes.   You have abdominal pain.  You have a fever.   You become sweaty or weak.   You are passing large blood clots from the vagina.   You start to feel nauseous and vomit. MAKE SURE YOU:   Understand these instructions.  Will watch your condition.  Will get help right away if you are not doing well or get worse. Document Released: 04/14/2005 Document Revised: 04/19/2013 Document Reviewed: 11/11/2012 [facility] Patient Information 2015 [place], [place]. This  information is not intended to replace advice given to you by your health care provider. Make sure you discuss any questions you have with your health care provider.  Anemia, Nonspecific Anemia is a condition in which the concentration of red blood cells or hemoglobin in the blood is below normal. Hemoglobin is a substance in red blood cells that carries oxygen to the tissues of the body. Anemia results in not enough oxygen reaching these tissues.  CAUSES  Common causes of anemia include:   Excessive bleeding. Bleeding may be internal or external. This includes excessive bleeding from periods (in women) or from the intestine.   Poor nutrition.   Chronic kidney, thyroid, and liver disease.  Bone marrow disorders that decrease red blood cell production.  Cancer and treatments for cancer.  HIV, AIDS, and their treatments.  Spleen problems that increase red blood cell destruction.  Blood disorders.  Excess destruction of red blood cells due to infection, medicines, and autoimmune disorders. SIGNS AND SYMPTOMS   Minor weakness.   Dizziness.   Headache.  Palpitations.   Shortness of breath, especially with exercise.   Paleness.  Cold sensitivity.  Indigestion.  Nausea.  Difficulty sleeping.  Difficulty concentrating. Symptoms may occur suddenly or they may develop slowly.  DIAGNOSIS  Additional blood tests are often needed. These help your health care provider determine the best treatment. Your health care provider will check your stool for blood and look for other causes of blood loss.  TREATMENT  Treatment varies depending on the cause of the anemia. Treatment can include:   Supplements of iron, vitamin B12,  or folic acid.   Hormone medicines.   A blood transfusion. This may be needed if blood loss is severe.   Hospitalization. This may be needed if there is significant continual blood loss.   Dietary changes.  Spleen removal. HOME CARE  INSTRUCTIONS Keep all follow-up appointments. It often takes many weeks to correct anemia, and having your health care provider check on your condition and your response to treatment is very important. SEEK IMMEDIATE MEDICAL CARE IF:   You develop extreme weakness, shortness of breath, or chest pain.   You become dizzy or have trouble concentrating.  You develop heavy vaginal bleeding.   You develop a rash.   You have bloody or black, tarry stools.   You faint.   You vomit up blood.   You vomit repeatedly.   You have abdominal pain.  You have a fever or persistent symptoms for more than 2-3 days.   You have a fever and your symptoms suddenly get worse.   You are dehydrated.  MAKE SURE YOU:  Understand these instructions.  Will watch your condition.  Will get help right away if you are not doing well or get worse. Document Released: 05/22/2004 Document Revised: 12/15/2012 Document Reviewed: 10/08/2012 [facility] Patient Information 2015 [place], [place]. This information is not intended to replace advice given to you by your health care provider. Make sure you discuss any questions you have with your health care provider.

## 2014-01-31 NOTE — MAU Provider Note (Signed)
History     CSN: [id number]  Arrival date and time: 01/31/14 1410   None     Chief Complaint  Patient presents with  . Vaginal Bleeding  . Fatigue   Patient is a 23 y.o. female presenting with vaginal bleeding. The history is provided by the patient.  Vaginal Bleeding This is a new problem. The problem occurs constantly. The problem has been gradually worsening. Associated symptoms include chills and headaches (that she contributes to having not eaten today.). Pertinent negatives include no abdominal pain, congestion, coughing, fever, neck pain, rash or sore throat. Nothing aggravates the symptoms. She has tried nothing for the symptoms.    Pertinent Gynecological History: Menses: flow is excessive with use of 4 pads or tampons on heaviest days Bleeding: dysfunctional uterine bleeding Contraception: none DES exposure: denies Blood transfusions: none Sexually transmitted diseases: no past history Previous GYN Procedures: none  Last mammogram: never Last pap: never had one   AUDRYNA WENDT is a 23 y.o. G0 who presents to the MAU with abnormal vaginal bleeding. She states that her period started September 1st and has continued. It stopped for 2 days and then started back. She went to the ED at [facility] and told to take OTC Fe for anemia but nothing for the bleeding. She starting taking the Fe but it caused her to feel nauseated in the morning after she took it so she stopped.  She does not have a GYN doctor. She has a history of irregular periods. She may go 2 months without a period and then may bleed for a month but usually doesn't get this dizziness and feel so tired.    Past Medical History  Diagnosis Date  . Obesity   . Esophageal reflux 06/14/2012  . 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  There is  anterior subluxation of the right humeral head from right glenohumeral joint.  IMPRESSION: Anterior subluxation of the right humeral head.     No past surgical history on file.  Family History  Problem Relation Age of Onset  . Cancer Father 35    lung cancer, smoker   . Diabetes Neg Hx   . Hypertension Neg Hx     History  Substance Use Topics  . Smoking status: Never Smoker   . Smokeless tobacco: Never Used  . Alcohol Use: Yes    Allergies: No Known Allergies  Prescriptions prior to admission  Medication Sig Dispense Refill  . Aspirin-Salicylamide-Caffeine (BC HEADACHE POWDER PO) Take 1 Applicatorful by mouth once.      . ferrous sulfate 325 (65 FE) MG tablet Take 325 mg by mouth daily with breakfast.      . guaiFENesin-dextromethorphan (ROBITUSSIN DM) 100-10 MG/5ML syrup Take 15 mLs by mouth every 4 (four) hours as needed for cough.      . ondansetron (ZOFRAN) 4 MG tablet Take 1 tablet (4 mg total) by mouth every 6 (six) hours.  12 tablet  0    Review of Systems  Constitutional: Positive for chills. Negative for fever.  HENT: Negative for congestion, ear pain and sore throat.   Eyes: Negative for blurred vision, pain and redness.  Respiratory: Negative for cough and wheezing.   Gastrointestinal: Negative for abdominal pain, diarrhea and constipation.  Genitourinary: Positive for vaginal bleeding.  Musculoskeletal: Negative for back  pain, joint pain and neck pain.  Skin: Negative for itching and rash.  Neurological: Positive for dizziness and headaches (that she contributes to having not eaten today.). Negative for loss of consciousness.  Psychiatric/Behavioral: Negative for depression. The patient is not nervous/anxious.    Physical Exam   Blood pressure 145/77, pulse 110, temperature 98.9 F (37.2 C), temperature source Oral, resp. rate 18, last menstrual period 12/27/2013, SpO2 100.00%.  Physical Exam  Nursing note and vitals reviewed. Constitutional: She is  oriented to person, place, and time. She appears well-developed and well-nourished. No distress.  HENT:  Head: Normocephalic and atraumatic.  Eyes: EOM are normal.  Neck: Neck supple.  Cardiovascular: Tachycardia present.   Respiratory: Effort normal.  GI: Soft. There is no tenderness.  Genitourinary:  Patient declined stating she has never had sex and does not want an exam.   Musculoskeletal: Normal range of motion.  Neurological: She is alert and oriented to person, place, and time.  Skin: Skin is warm and dry.  Psychiatric: She has a normal mood and affect. Her behavior is normal. Judgment and thought content normal.   Results for orders placed during the hospital encounter of 01/31/14 (from the past 24 hour(s))  URINALYSIS, ROUTINE W REFLEX MICROSCOPIC     Status: Abnormal   Collection Time    01/31/14  2:25 PM      Result Value Ref Range   Color, Urine RED (*) YELLOW   APPearance TURBID (*) CLEAR   Specific Gravity, Urine 1.025  1.005 - 1.030   pH 5.5  5.0 - 8.0   Glucose, UA NEGATIVE  NEGATIVE mg/dL   Hgb urine dipstick LARGE (*) NEGATIVE   Bilirubin Urine NEGATIVE  NEGATIVE   Ketones, ur 15 (*) NEGATIVE mg/dL   Protein, ur 578 (*) NEGATIVE mg/dL   Urobilinogen, UA 1.0  0.0 - 1.0 mg/dL   Nitrite NEGATIVE  NEGATIVE   Leukocytes, UA NEGATIVE  NEGATIVE  URINE MICROSCOPIC-ADD ON     Status: Abnormal   Collection Time    01/31/14  2:25 PM      Result Value Ref Range   Bacteria, UA MANY (*) RARE   Urine-Other FIELD OBSCURED BY RBC'S    POCT PREGNANCY, URINE     Status: None   Collection Time    01/31/14  2:46 PM      Result Value Ref Range   Preg Test, Ur NEGATIVE  NEGATIVE  CBC     Status: Abnormal   Collection Time    01/31/14  4:25 PM      Result Value Ref Range   WBC 12.2 (*) 4.0 - 10.5 K/uL   RBC 4.16  3.87 - 5.11 MIL/uL   Hemoglobin 7.9 (*) 12.0 - 15.0 g/dL   HCT 46.9 (*) 62.9 - 52.8 %   MCV 67.5 (*) 78.0 - 100.0 fL   MCH 19.0 (*) 26.0 - 34.0 pg   MCHC 28.1  (*) 30.0 - 36.0 g/dL   RDW 41.3 (*) 24.4 - 01.0 %   Platelets 338  150 - 400 K/uL    MAU Course  Procedures BP 145/77  Pulse 110  Temp(Src) 98.9 F (37.2 C) (Oral)  Resp 18  SpO2 100%  LMP 12/27/2013  Orthostatic  Lying      102/59  P 94                     Sitting     117/74 P 90  Standing 138/91 P124 MDM I discussed this case with Dr. Debroah LoopArnold and he agrees with assessment/plan. She will call for a follow up appointment.   Assessment and Plan  23 y.o. female with abnormal vaginal bleeding and anemia that is stable since [facility] Will start Megace and she will follow up in the GYN Clinic. She will return here for worsening symptoms. Stable for discharge with Hgb. Unchanged from 3 weeks ago.  Discussed with the patient and all questioned fully answered.     Medication List    TAKE these medications       ferrous sulfate 325 (65 FE) MG tablet  Take 1 tablet (325 mg total) by mouth daily.     megestrol 20 MG tablet  Commonly known as:  MEGACE  - Take two tablets (40 mg) three times per day time three days,   - then take two tablets (40 mg) two times per day time three days,   - then take two tablets (40 mg)once per day      ASK your doctor about these medications       BC HEADACHE POWDER PO  Take 1 Applicatorful by mouth once.     guaiFENesin-dextromethorphan 100-10 MG/5ML syrup  Commonly known as:  ROBITUSSIN DM  Take 15 mLs by mouth every 4 (four) hours as needed for cough.     ondansetron 4 MG tablet  Commonly known as:  ZOFRAN  Take 1 tablet (4 mg total) by mouth every 6 (six) hours.        Marland Kitchen.Doye Montilla 01/31/2014, 4:47 PM

## 2014-01-31 NOTE — MAU Note (Signed)
Pt sent to [facility] from [facility], pt states she has had a period for 2 months, C/O weakness.  Pt has changed 2 pads since 0700, passing clots.  Denies pain.

## 2014-01-31 NOTE — Discharge Instructions (Signed)
[facility]. °

## 2014-01-31 NOTE — ED Notes (Signed)
[facility] Spoke with Ander PurpuraSusan Carra RN in admissions to inform that pt was coming to ED name and DOB given to nurse

## 2014-01-31 NOTE — MAU Note (Signed)
Pt states she has been "on her period since September 1,2015. I am loosing too much blood and weak."

## 2014-02-03 ENCOUNTER — Telehealth: Payer: Self-pay | Admitting: Family Medicine

## 2014-02-03 ENCOUNTER — Ambulatory Visit (INDEPENDENT_AMBULATORY_CARE_PROVIDER_SITE_OTHER): Payer: Self-pay | Admitting: Family Medicine

## 2014-02-03 ENCOUNTER — Encounter: Payer: Self-pay | Admitting: Family Medicine

## 2014-02-03 VITALS — BP 141/91 | HR 99 | Temp 98.4°F | Ht 61.0 in | Wt 357.3 lb

## 2014-02-03 DIAGNOSIS — J069 Acute upper respiratory infection, unspecified: Secondary | ICD-10-CM | POA: Insufficient documentation

## 2014-02-03 DIAGNOSIS — B349 Viral infection, unspecified: Secondary | ICD-10-CM | POA: Insufficient documentation

## 2014-02-03 DIAGNOSIS — J029 Acute pharyngitis, unspecified: Secondary | ICD-10-CM

## 2014-02-03 MED ORDER — GUAIFENESIN-CODEINE 100-10 MG/5ML PO SOLN: 10.0000 mL | ORAL | Status: DC | PRN

## 2014-02-03 MED ORDER — BENZONATATE 200 MG PO CAPS: 200.0000 mg | ORAL_CAPSULE | Freq: Two times a day (BID) | ORAL | Status: DC | PRN

## 2014-02-03 NOTE — Patient Instructions (Signed)
Pharyngitis Pharyngitis is redness, pain, and swelling (inflammation) of your pharynx.  CAUSES  Pharyngitis is usually caused by infection. Most of the time, these infections are from viruses (viral) and are part of a cold. However, sometimes pharyngitis is caused by bacteria (bacterial). Pharyngitis can also be caused by allergies. Viral pharyngitis may be spread from person to person by coughing, sneezing, and personal items or utensils (cups, forks, spoons, toothbrushes). Bacterial pharyngitis may be spread from person to person by more intimate contact, such as kissing.  SIGNS AND SYMPTOMS  Symptoms of pharyngitis include:   Sore throat.   Tiredness (fatigue).   Low-grade fever.   Headache.  Joint pain and muscle aches.  Skin rashes.  Swollen lymph nodes.  Plaque-like film on throat or tonsils (often seen with bacterial pharyngitis). DIAGNOSIS  Your health care provider will ask you questions about your illness and your symptoms. Your medical history, along with a physical exam, is often all that is needed to diagnose pharyngitis. Sometimes, a rapid strep test is done. Other lab tests may also be done, depending on the suspected cause.  TREATMENT  Viral pharyngitis will usually get better in 3-4 days without the use of medicine. Bacterial pharyngitis is treated with medicines that kill germs (antibiotics).  HOME CARE INSTRUCTIONS   Drink enough water and fluids to keep your urine clear or pale yellow.   Only take over-the-counter or prescription medicines as directed by your health care provider:   If you are prescribed antibiotics, make sure you finish them even if you start to feel better.   Do not take aspirin.   Get lots of rest.   Gargle with 8 oz of salt water ( tsp of salt per 1 qt of water) as often as every 1-2 hours to soothe your throat.   Throat lozenges (if you are not at risk for choking) or sprays may be used to soothe your throat. SEEK MEDICAL  CARE IF:   You have large, tender lumps in your neck.  You have a rash.  You cough up green, yellow-brown, or bloody spit. SEEK IMMEDIATE MEDICAL CARE IF:   Your neck becomes stiff.  You drool or are unable to swallow liquids.  You vomit or are unable to keep medicines or liquids down.  You have severe pain that does not go away with the use of recommended medicines.  You have trouble breathing (not caused by a stuffy nose). MAKE SURE YOU:   Understand these instructions.  Will watch your condition.  Will get help right away if you are not doing well or get worse. Document Released: 04/14/2005 Document Revised: 02/02/2013 Document Reviewed: 12/20/2012 [facility] Patient Information 2015 [place]. This information is not intended to replace advice given to you by your health care provider. Make sure you discuss any questions you have with your health care provider.  I have called in a medication, cough syrup with codeine, to take as needed every 4-6 hours. This medication can cause drowsiness. I have also called in a cough medication, Tessalon Perles, you can use these during the day and will not cause drowsiness. We will write to a work excuse to have you how until Monday morning. I would've eyes you should to pick up some saline nasal spray, and use Tylenol or Motrin if you develop a fever or headache. If you're not feeling better within the next 7-10 days I would be seen again.

## 2014-02-03 NOTE — Progress Notes (Signed)
   Subjective:    Patient ID: Paula Drake, female    DOB: 08/19/1990, 23 y.o.   MRN: [id number]  HPI Paula Simmeringabria F Jaquess is a  23 y.o. female presents to same-day clinic  Cough: Patient states she has had a cough that started yesterday. She has been exposed to this contact which had similar symptoms. She reports it is a sometimes with coughing which she is producing phlegm. She feels like her chest is burning from all the coughing. She endorses runny/congestion nose, postnasal drip, headaches and ear pain. She is eating and drinking well. She denies any fever, chills, muscle aches, diarrhea, constipation, itchy watery eyes, sore throat, shortness of breath or abdominal pain. She has no history of asthma or COPD. She does smoke [place] occasionally. She has tried some over-the-counter cough medicines and cough drops, both which are not working well.  Past Medical History  Diagnosis Date  . Obesity   . Esophageal reflux 06/14/2012  . 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  There is anterior subluxation of the right humeral head from right glenohumeral joint.  IMPRESSION: Anterior subluxation of the right humeral head.      Review of Systems Per history of present illness    Objective:   Physical Exam BP 141/91  Pulse 99  Temp(Src) 98.4 F (36.9 C) (Oral)  Ht 5\' 1"  (1.549 m)  Wt 357 lb 4.8 oz (162.07 kg)  BMI 67.55 kg/m2  LMP 12/27/2013 Gen: Pleasant, cooperative, African American female, morbidly obese, no acute distress, nontoxic in appearance. HEENT: AT. [place]. Bilateral TM visualized and normal in appearance. Bilateral eyes without injections or icterus. MMM. Bilateral nares without erythema or swelling. Throat with mild erythema, no exudates. Neck: Supple, mild anterior lymphadenopathy.  CV: RRR, no murmur Chest:  CTAB, no wheeze or crackles Abd: Soft. Morbidly obese. NTND. BS present. No Masses palpated.     Assessment & Plan:

## 2014-02-03 NOTE — Telephone Encounter (Signed)
Patient call at 1245 am reporting severe cough since yesterday. She states that this started [facility] Cough is "severe" with associated white sputum production. I advised her to use OTC treatments; she reported that she has been doing this. I advised to call for a same day appt in the morning if she feels that she needs to be seen.

## 2014-02-03 NOTE — Assessment & Plan Note (Signed)
Patient likely with a viral pharyngitis/ upper respiratory infection. Encouraged patient to use saline nasal drops over the next few weeks. And treat symptoms with Tylenol/Advil. I have called in for guaifenesin with codeine for nighttime cough, and Tessalon Perles for daytime cough. Discussed viral versus bacterial upper respiratory infections Patient to followup in one to 2 weeks, or sooner if no improvement.

## 2014-02-08 IMAGING — CR DG CHEST 2V
2 series · 2 of 2 positions shown · non-contrast
Comparison: Chest x-ray 02/13/2007.

CLINICAL DATA: Chest pain.

CHEST - 2 VIEW

[w chest pa *]
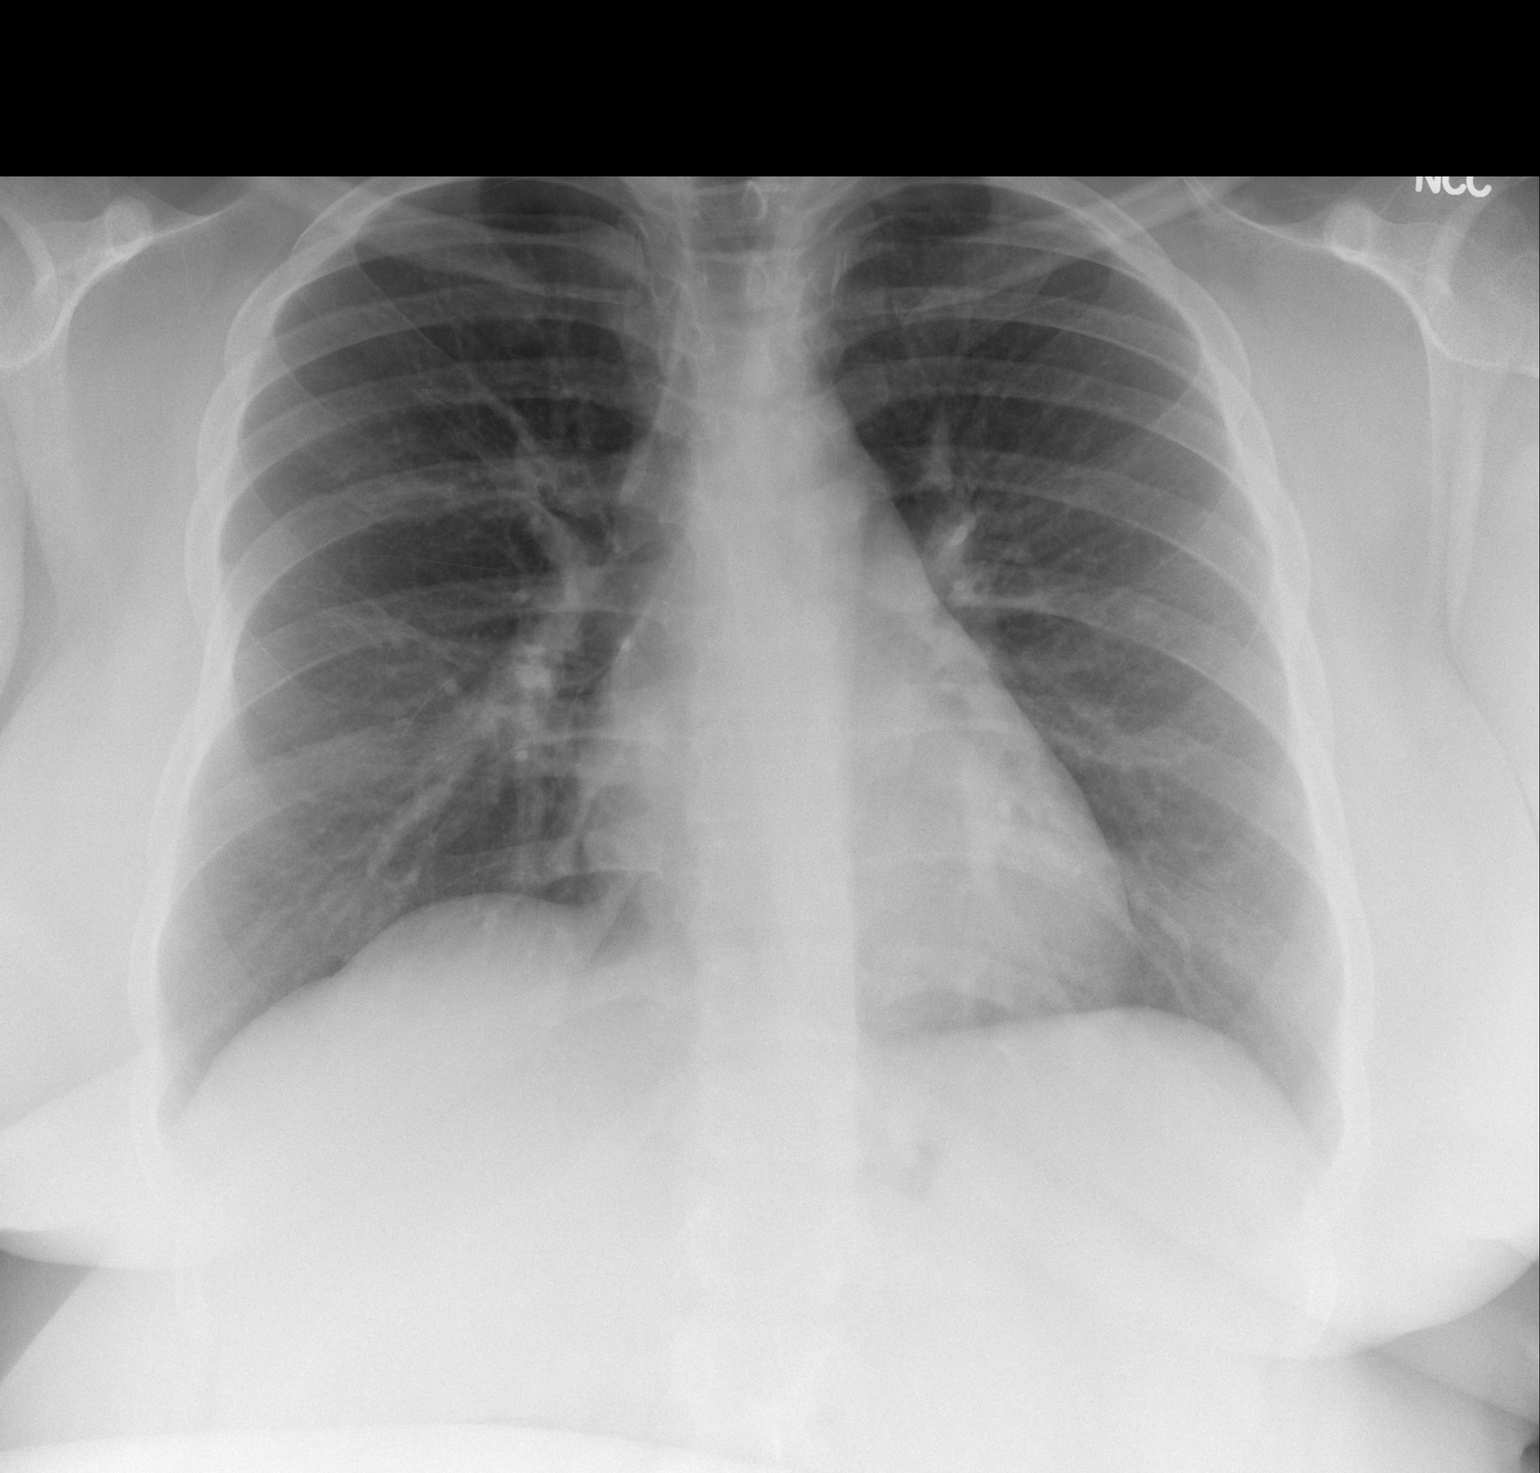

[w chest lat *]
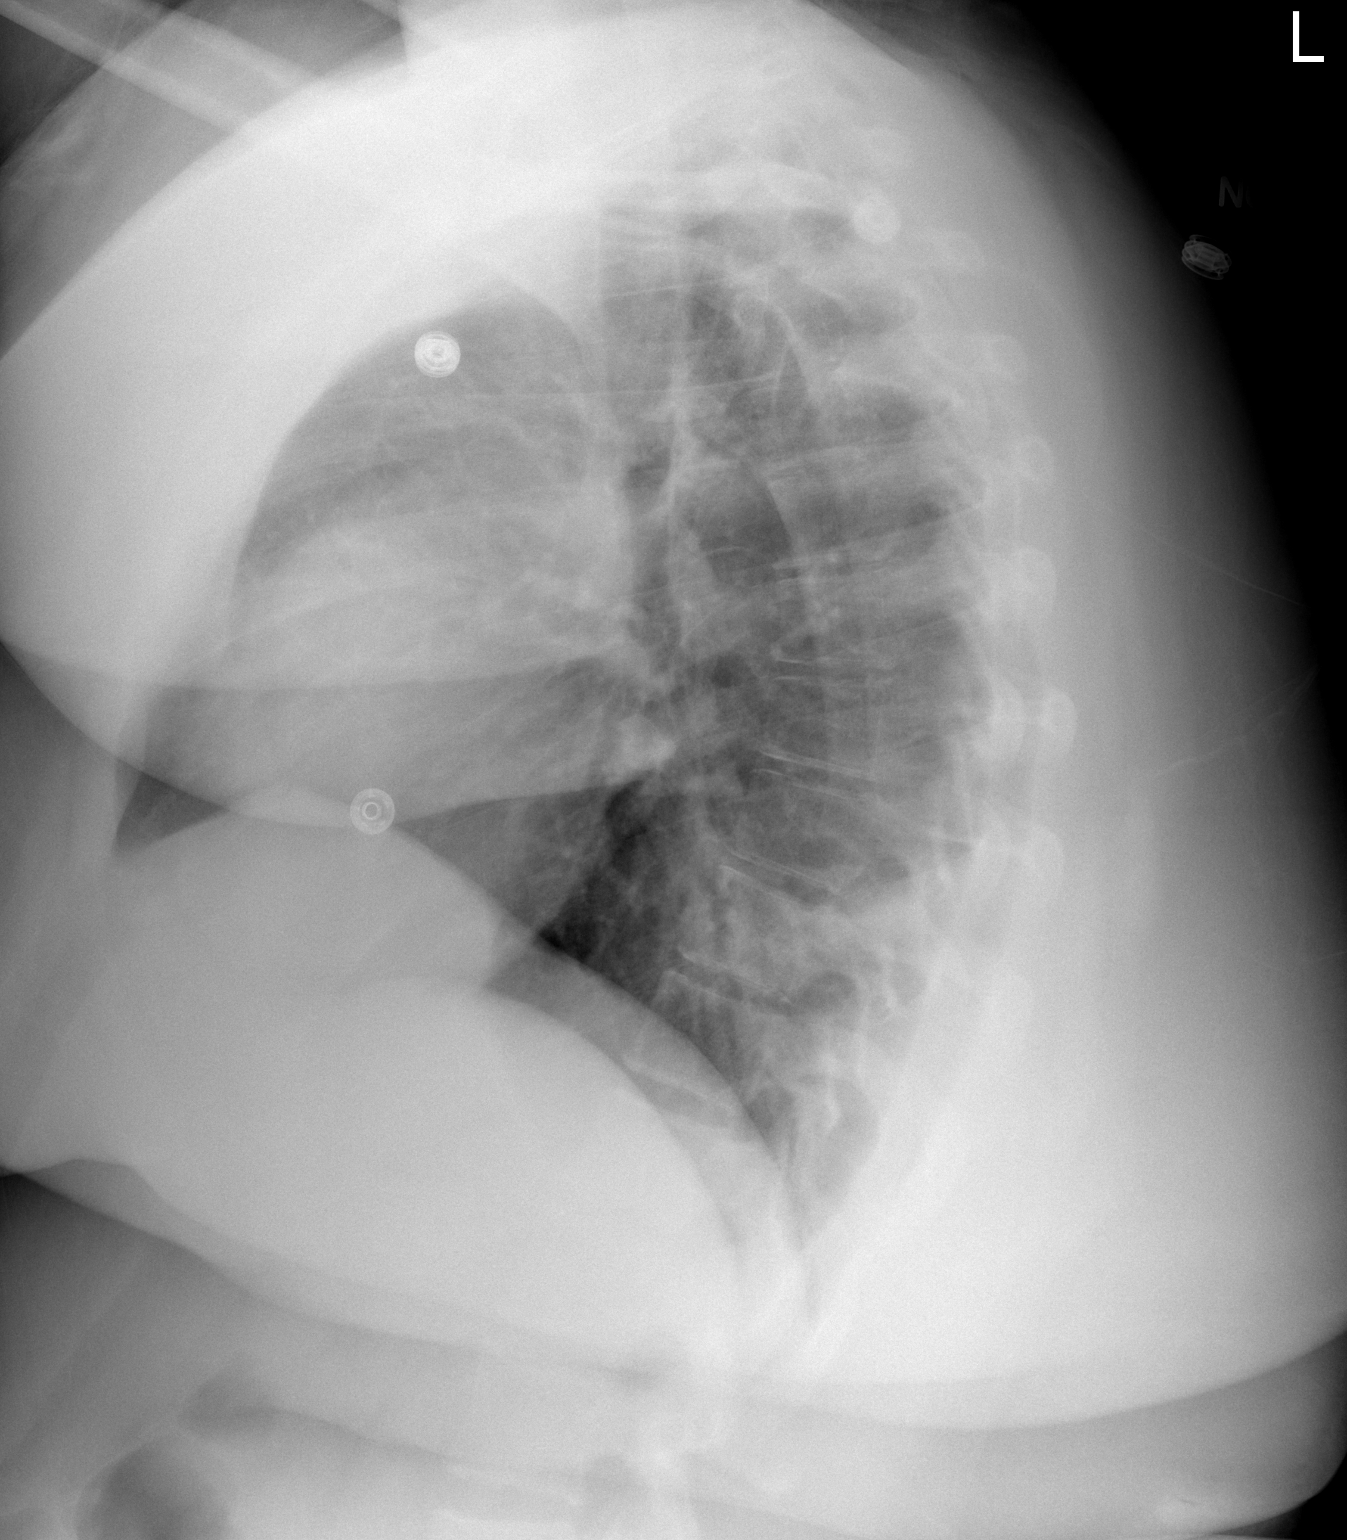

[2 of 2 positions shown; findings below may reference images not displayed]

FINDINGS: Lung volumes are normal.  No consolidative airspace
disease.  No pleural effusions.  No pneumothorax.  No pulmonary
nodule or mass noted.  Pulmonary vasculature and the
cardiomediastinal silhouette are within normal limits.
IMPRESSION: 1. No radiographic evidence of acute cardiopulmonary disease.

## 2014-06-02 ENCOUNTER — Emergency Department (HOSPITAL_COMMUNITY)
Admission: EM | Admit: 2014-06-02 | Discharge: 2014-06-03 | Disposition: A | Discharge status: Home / Self Care | Payer: Medicaid Other | Attending: Emergency Medicine | Admitting: Emergency Medicine

## 2014-06-02 ENCOUNTER — Encounter (HOSPITAL_COMMUNITY): Payer: Self-pay | Admitting: Family Medicine

## 2014-06-02 DIAGNOSIS — E669 Obesity, unspecified: Secondary | ICD-10-CM | POA: Insufficient documentation

## 2014-06-02 DIAGNOSIS — J020 Streptococcal pharyngitis: Secondary | ICD-10-CM | POA: Insufficient documentation

## 2014-06-02 DIAGNOSIS — R000 Tachycardia, unspecified: Secondary | ICD-10-CM | POA: Insufficient documentation

## 2014-06-02 DIAGNOSIS — Z8719 Personal history of other diseases of the digestive system: Secondary | ICD-10-CM | POA: Insufficient documentation

## 2014-06-02 DIAGNOSIS — Z79899 Other long term (current) drug therapy: Secondary | ICD-10-CM | POA: Insufficient documentation

## 2014-06-02 MED ORDER — ACETAMINOPHEN 325 MG PO TABS
650.0000 mg | ORAL_TABLET | Freq: Once | ORAL | Status: DC
  Filled 2014-06-02: qty 2

## 2014-06-02 MED ORDER — HYDROCODONE-ACETAMINOPHEN 5-325 MG PO TABS
1.0000 | ORAL_TABLET | Freq: Once | ORAL | Status: AC
  Administered 2014-06-02: 1 via ORAL
  Filled 2014-06-02: qty 1

## 2014-06-02 MED ORDER — MORPHINE SULFATE 4 MG/ML IJ SOLN
4.0000 mg | Freq: Once | INTRAMUSCULAR | Status: AC
  Administered 2014-06-02: 4 mg via INTRAVENOUS
  Filled 2014-06-02: qty 1

## 2014-06-02 MED ORDER — SODIUM CHLORIDE 0.9 % IV BOLUS (SEPSIS)
1000.0000 mL | Freq: Once | INTRAVENOUS | Status: AC
  Administered 2014-06-02: 1000 mL via INTRAVENOUS

## 2014-06-02 MED ORDER — DEXAMETHASONE 4 MG PO TABS
10.0000 mg | ORAL_TABLET | Freq: Once | ORAL | Status: AC
  Administered 2014-06-02: 10 mg via ORAL
  Filled 2014-06-02: qty 3

## 2014-06-02 MED ORDER — ACETAMINOPHEN 325 MG PO TABS
650.0000 mg | ORAL_TABLET | Freq: Once | ORAL | Status: AC
  Administered 2014-06-02: 650 mg via ORAL

## 2014-06-02 MED ORDER — PENICILLIN G BENZATHINE 1200000 UNIT/2ML IM SUSP
1.2000 10*6.[IU] | Freq: Once | INTRAMUSCULAR | Status: AC
Start: 2014-06-02 — End: 2014-06-02
  Administered 2014-06-02: 1.2 10*6.[IU] via INTRAMUSCULAR
  Filled 2014-06-02: qty 2

## 2014-06-02 MED ORDER — ONDANSETRON HCL 4 MG/2ML IJ SOLN
4.0000 mg | Freq: Once | INTRAMUSCULAR | Status: AC
  Administered 2014-06-02: 4 mg via INTRAVENOUS
  Filled 2014-06-02: qty 2

## 2014-06-02 MED ORDER — HYDROCODONE-ACETAMINOPHEN 7.5-325 MG/15ML PO SOLN: 15.0000 mL | Freq: Four times a day (QID) | ORAL | Status: DC | PRN

## 2014-06-02 NOTE — ED Provider Notes (Addendum)
CSN: [id number]     Arrival date & time 06/02/14  1745 History   First MD Initiated Contact with Patient 06/02/14 2040     Chief Complaint  Patient presents with  . Shortness of Breath  . Panic Attack  . Abdominal Pain     (Consider location/radiation/quality/duration/timing/severity/associated sxs/prior Treatment) Patient is a 24 y.o. female presenting with pharyngitis. The history is provided by the patient.  Sore Throat This is a new (Patient states last night after standing on the cold when she came inside she started feeling bad and it worsened today) problem. The current episode started yesterday. The problem occurs constantly. The problem has been gradually worsening. Associated symptoms include shortness of breath. Pertinent negatives include no chest pain and no abdominal pain. Associated symptoms comments: Patient initially states that she was short of breath however that has resolved. She states she just feels sick has general malaise. No cough, nausea, vomiting.. The symptoms are aggravated by swallowing. Relieved by: Some improvement with Advil and Tylenol. She has tried acetaminophen for the symptoms. The treatment provided mild relief.    Past Medical History  Diagnosis Date  . Obesity   . Esophageal reflux 06/14/2012  . 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  There is anterior subluxation of the right humeral head from right glenohumeral joint.  IMPRESSION: Anterior subluxation of the right humeral head.    History reviewed. No pertinent past surgical history. Family History  Problem Relation Age of Onset  . Cancer Father 4748    lung cancer, smoker   . Diabetes Neg Hx   . Hypertension Neg Hx    History  Substance Use Topics  . Smoking status: Never Smoker   . Smokeless tobacco: Never Used  .  Alcohol Use: Yes   OB History    Gravida Para Term Preterm AB TAB SAB Ectopic Multiple Living   0              Review of Systems  Constitutional: Positive for chills.  Respiratory: Positive for shortness of breath. Negative for cough.   Cardiovascular: Negative for chest pain.  Gastrointestinal: Negative for nausea, vomiting and abdominal pain.  Musculoskeletal: Positive for myalgias.  All other systems reviewed and are negative.     Allergies  Review of patient's allergies indicates no known allergies.  Home Medications   Prior to Admission medications   Medication Sig Start Date End Date Taking? Authorizing Provider  Aspirin-Salicylamide-Caffeine (BC HEADACHE POWDER PO) Take 1 Applicatorful by mouth once.    Historical Provider, MD  benzonatate (TESSALON) 200 MG capsule Take 1 capsule (200 mg total) by mouth 2 (two) times daily as needed for cough. 02/03/14   Renee A Kuneff, DO  ferrous sulfate 325 (65 FE) MG tablet Take 1 tablet (325 mg total) by mouth daily. 01/31/14   Hope Orlene OchM Neese, NP  guaiFENesin-codeine 100-10 MG/5ML syrup Take 10 mLs by mouth every 4 (four) hours as needed for cough. 02/03/14   Renee A Kuneff, DO  guaiFENesin-dextromethorphan (ROBITUSSIN DM) 100-10 MG/5ML syrup Take 15 mLs by mouth every 4 (four) hours as needed for cough.    Historical Provider, MD  megestrol (MEGACE) 20 MG tablet Take two tablets (40 mg) three times per day time three days,  then take two tablets (40 mg) two times per day time  three days,  then take two tablets (40 mg)once per day 01/31/14   Janne Napoleon, NP  ondansetron (ZOFRAN) 4 MG tablet Take 1 tablet (4 mg total) by mouth every 6 (six) hours. 01/09/14   Earle Gell Cartner, PA-C   BP 122/77 mmHg  Pulse 127  Temp(Src) 103 F (39.4 C) (Oral)  Resp 16  SpO2 100% Physical Exam  Constitutional: She is oriented to person, place, and time. She appears well-developed and well-nourished. No distress.  Obese female  HENT:  Head:  Normocephalic and atraumatic.  Right Ear: Tympanic membrane and ear canal normal.  Left Ear: Tympanic membrane and ear canal normal.  Mouth/Throat: Mucous membranes are dry. Oropharyngeal exudate, posterior oropharyngeal edema and posterior oropharyngeal erythema present.  Eyes: Conjunctivae and EOM are normal. Pupils are equal, round, and reactive to light.  Neck: Normal range of motion. Neck supple.  Cardiovascular: Regular rhythm and intact distal pulses.  Tachycardia present.   No murmur heard. Pulmonary/Chest: Effort normal and breath sounds normal. No respiratory distress. She has no wheezes. She has no rales.  Abdominal: Soft. She exhibits no distension. There is no tenderness. There is no rebound and no guarding.  Musculoskeletal: Normal range of motion. She exhibits no edema or tenderness.  Lymphadenopathy:    She has cervical adenopathy.  Neurological: She is alert and oriented to person, place, and time.  Skin: Skin is warm and dry. No rash noted. No erythema.  Psychiatric: She has a normal mood and affect. Her behavior is normal.  Nursing note and vitals reviewed.   ED Course  Procedures (including critical care time) Labs Review Labs Reviewed - No data to display  Imaging Review No results found.   EKG Interpretation   Date/Time:  Friday June 02 2014 17:51:05 EST Ventricular Rate:  145 PR Interval:  120 QRS Duration: 64 QT Interval:  264 QTC Calculation: 410 R Axis:   128 Text Interpretation:  Sinus tachycardia Right axis deviation No  significant change since last tracing Confirmed by Anitra Lauth  MD, Alphonzo Lemmings  [phone]) on 06/02/2014 8:46:12 PM      MDM   Final diagnoses:  Strep pharyngitis    Patient with one day of generalized delays, sore throat and not feeling well. Here patient is tachycardic and on recheck found to have a temperature of 103. Physical exam consistent with strep pharyngitis with exudative tonsils, tender cervical lymphadenopathy. No  cough.  Patient initially states she felt short of breath earlier however denies any shortness of breath and is satting 100%. No prior history of asthma.  We'll treat for strep pharyngitis with IM penicillin, Decadron and fever control.  10:54 PM Pt still feeling sick and tachycardic.  Will give IVF and pain control prior to d/c    Gwyneth Sprout, MD 06/02/14 2115  Gwyneth Sprout, MD 06/02/14 [phone]

## 2014-06-02 NOTE — ED Notes (Signed)
Pt here for SOB, abd pain that started today. Denies chest pain, cough, fever. Pt having panic attack at triage. sats 100%. Tachy around 140 and RR 40

## 2014-06-02 NOTE — Discharge Instructions (Signed)

## 2014-07-06 ENCOUNTER — Encounter: Payer: Medicaid Other | Admitting: Family Medicine

## 2014-08-31 ENCOUNTER — Encounter: Payer: Medicaid Other | Admitting: Family Medicine

## 2014-09-21 ENCOUNTER — Encounter: Payer: Medicaid Other | Admitting: Family Medicine

## 2014-10-11 ENCOUNTER — Encounter: Payer: Self-pay | Admitting: Family Medicine

## 2014-10-11 ENCOUNTER — Ambulatory Visit (INDEPENDENT_AMBULATORY_CARE_PROVIDER_SITE_OTHER): Payer: Self-pay | Admitting: Family Medicine

## 2014-10-11 VITALS — BP 132/78 | HR 65 | Temp 97.7°F | Wt 366.0 lb

## 2014-10-11 DIAGNOSIS — J029 Acute pharyngitis, unspecified: Secondary | ICD-10-CM

## 2014-10-11 LAB — POCT RAPID STREP A (OFFICE): RAPID STREP A SCREEN: NEGATIVE

## 2014-10-11 MED ORDER — CETIRIZINE HCL 10 MG PO TABS: 10.0000 mg | ORAL_TABLET | Freq: Every day | ORAL | Status: DC

## 2014-10-11 MED ORDER — HYDROCORTISONE VALERATE 0.2 % EX OINT: 1.0000 "application " | TOPICAL_OINTMENT | Freq: Two times a day (BID) | CUTANEOUS | Status: DC | PRN

## 2014-10-11 MED ORDER — PENICILLIN V POTASSIUM 500 MG PO TABS: 500.0000 mg | ORAL_TABLET | Freq: Three times a day (TID) | ORAL | Status: DC

## 2014-10-11 NOTE — Progress Notes (Signed)
Patient ID: BELLATRIX BEDEL, female   DOB: [birth year], 24 y.o.   MRN: [id number]  Marikay Alar, MD Phone: [phone]  Paula Drake is a 24 y.o. female who presents today for same day appointment.  Notes 3 days of sore throat and cough. Temp to 101 F 2 days ago. No fever since then. Has nasal congestion and ear discomfort as well. No nausea, vomiting, or diarrhea. Throat is sore with swallowing though can swallow. Notes history of strep pharyngitis in the past. No sick contacts. Taking mucinex for this with no benefit.   PMH: nonsmoker.   ROS: Per HPI   Physical Exam Filed Vitals:   10/11/14 1007  BP: 164/92  Pulse: 65  Temp: 97.7 F (36.5 C)    Gen: Well NAD HEENT: PERRL,  MMM, tonsils 1+ and equal bilaterally with no exudate, no midline uvula shift, mild posterior OP erythema, no cervical LAD or neck swelling, mild tenderness along anterior cervical chain Lungs: CTABL Nl WOB Heart: RRR, no murmur appreciated Exts: Non edematous BL  LE, warm and well perfused.    Assessment/Plan: Please see individual problem list.  Marikay Alar, MD [facility] PGY-3

## 2014-10-11 NOTE — Patient Instructions (Signed)
Nice to meet you. We are going to treat you for strep throat and send for a culture. Please try the zyrtec and the steroid cream for the itchy spots.  If you develop fever, difficulty swallowing, muffled voice, neck pain, trouble breathing, or worsening rash please seek medical attention.

## 2014-10-12 DIAGNOSIS — J029 Acute pharyngitis, unspecified: Secondary | ICD-10-CM | POA: Insufficient documentation

## 2014-10-12 LAB — STREP A DNA PROBE: GASP: NEGATIVE

## 2014-10-12 NOTE — Assessment & Plan Note (Signed)
Patient with signs and symptoms of pharyngitis. Rapid strep negative and could be viral, though with centor score of 3 there remains a high likelihood that the patient has strep pharyngitis. Will send for culture and will treat with penicillin to cover for strep. Given return precautions.

## 2014-10-13 ENCOUNTER — Telehealth: Payer: Self-pay | Admitting: *Deleted

## 2014-10-13 ENCOUNTER — Encounter: Payer: Self-pay | Admitting: *Deleted

## 2014-10-13 NOTE — Telephone Encounter (Signed)
-----   Message from Paula Luis, MD sent at 10/12/2014  5:05 PM EDT ----- Please call and advise patient that strep culture was negative. Thanks.

## 2014-10-13 NOTE — Telephone Encounter (Signed)
Letter mailed to patient. Jazmin Hartsell,CMA  

## 2015-01-11 ENCOUNTER — Ambulatory Visit: Payer: Medicaid Other

## 2015-02-22 ENCOUNTER — Ambulatory Visit: Payer: Medicaid Other | Admitting: Family Medicine

## 2015-03-08 ENCOUNTER — Ambulatory Visit (INDEPENDENT_AMBULATORY_CARE_PROVIDER_SITE_OTHER): Payer: Medicaid Other | Admitting: Internal Medicine

## 2015-03-08 ENCOUNTER — Encounter: Payer: Self-pay | Admitting: Internal Medicine

## 2015-03-08 VITALS — BP 143/94 | HR 97 | Temp 98.4°F | Ht 61.0 in | Wt 376.7 lb

## 2015-03-08 DIAGNOSIS — Z30011 Encounter for initial prescription of contraceptive pills: Secondary | ICD-10-CM | POA: Diagnosis present

## 2015-03-08 MED ORDER — NORGESTIMATE-ETH ESTRADIOL 0.25-35 MG-MCG PO TABS: 1.0000 | ORAL_TABLET | Freq: Every day | ORAL | Status: DC

## 2015-03-08 NOTE — Patient Instructions (Signed)
I sent a prescription for Sprintec (birth control pill) to the pharmacy. Please use another form of contraception for the first 7 days while you are on the pill.     Oral Contraception Information Oral contraceptive pills (OCPs) are medicines taken to prevent pregnancy. OCPs work by preventing the ovaries from releasing eggs. The hormones in OCPs also cause the cervical mucus to thicken, preventing the sperm from entering the uterus. The hormones also cause the uterine lining to become thin, not allowing a fertilized egg to attach to the inside of the uterus. OCPs are highly effective when taken exactly as prescribed. However, OCPs do not prevent sexually transmitted diseases (STDs). Safe sex practices, such as using condoms along with the pill, can help prevent STDs.  Before taking the pill, you may have a physical exam and Pap test. Your health care provider may order blood tests. The health care provider will make sure you are a good candidate for oral contraception. Discuss with your health care provider the possible side effects of the OCP you may be prescribed. When starting an OCP, it can take 2 to 3 months for the body to adjust to the changes in hormone levels in your body.  TYPES OF ORAL CONTRACEPTION  The combination pill--This pill contains estrogen and progestin (synthetic progesterone) hormones. The combination pill comes in 21-day, 28-day, or 91-day packs. Some types of combination pills are meant to be taken continuously (365-day pills). With 21-day packs, you do not take pills for 7 days after the last pill. With 28-day packs, the pill is taken every day. The last 7 pills are without hormones. Certain types of pills have more than 21 hormone-containing pills. With 91-day packs, the first 84 pills contain both hormones, and the last 7 pills contain no hormones or contain estrogen only.  The minipill--This pill contains the progesterone hormone only. The pill is taken every day  continuously. It is very important to take the pill at the same time each day. The minipill comes in packs of 28 pills. All 28 pills contain the hormone.  ADVANTAGES OF ORAL CONTRACEPTIVE PILLS  Decreases premenstrual symptoms.   Treats menstrual period cramps.   Regulates the menstrual cycle.   Decreases a heavy menstrual flow.   May treatacne, depending on the type of pill.   Treats abnormal uterine bleeding.   Treats polycystic ovarian syndrome.   Treats endometriosis.   Can be used as emergency contraception.  THINGS THAT CAN MAKE ORAL CONTRACEPTIVE PILLS LESS EFFECTIVE OCPs can be less effective if:   You forget to take the pill at the same time every day.   You have a stomach or intestinal disease that lessens the absorption of the pill.   You take OCPs with other medicines that make OCPs less effective, such as antibiotics, certain HIV medicines, and some seizure medicines.   You take expired OCPs.   You forget to restart the pill on day 7, when using the packs of 21 pills.  RISKS ASSOCIATED WITH ORAL CONTRACEPTIVE PILLS  Oral contraceptive pills can sometimes cause side effects, such as:  Headache.  Nausea.  Breast tenderness.  Irregular bleeding or spotting. Combination pills are also associated with a small increased risk of:  Blood clots.  Heart attack.  Stroke.   This information is not intended to replace advice given to you by your health care provider. Make sure you discuss any questions you have with your health care provider.   Document Released: 07/05/2002 Document Revised: 02/02/2013  Document Reviewed: 10/03/2012 Elsevier Interactive Patient Education [employer]

## 2015-03-08 NOTE — Assessment & Plan Note (Signed)
We discussed various methods. Patient chose OCPs as she is unsure if she can wait at least 3 years to have children. Urine pregnancy test not done in clinic as patient ended last menstrual period this week. - Sprintec - advised to use other contraceptive method (condom) for the first 7 days after starting treatment - educated OCPs do not protect against STI

## 2015-03-08 NOTE — Progress Notes (Signed)
Patient ID: Paula Drake, female   DOB: 08/15/1990, 24 y.o.   MRN: [id number] Date of Visit: 03/08/2015   HPI: CC: contraceptive counseling   - patient is unsure of which method she would like; currently using condoms for contraception - Sexual history: 1 partner for the past year; no hx of STI - No hx DVT/PE, no hx of blood clotting disorder, does have migraines but without aura, non smoker, no personal or family history of breast or ovarian cancer - LMP: finished her last period this week - Menstrual history: history of irregular periods which can last from 2 days to 2 weeks; history of symptomatic dysfunctional bleeding for about 1 month in 01/2014 where patient was given Megestrol taper. Since treatment, patient has had shorter periods. Currently does not feel weak, fatigued, or lightheaded.   ROS: See HPI.  PMFSH: noted in HPI  PHYSICAL EXAM: BP 143/94 mmHg  Pulse 97  Temp(Src) 98.4 F (36.9 C) (Oral)  Ht 5\' 1"  (1.549 m)  Wt 376 lb 11.2 oz (170.87 kg)  BMI 71.21 kg/m2  SpO2 99% GEN: NAD CV: RRR, no murmurs, rubs, or gallops PULM: CTAB, normal effort ABD: Soft, nontender, nondistended, NABS, no organomegaly  ASSESSMENT/PLAN:   Contraception We discussed various methods. Patient chose OCPs as she is unsure if she can wait at least 3 years to have children. Urine pregnancy test not done in clinic as patient ended last menstrual period this week. - Sprintec - advised to use other contraceptive method (condom) for the first 7 days after starting treatment - educated OCPs do not protect against STI   FOLLOW UP: Follow up as needed   Kandis MannanKanishka Gunadasa, MD [facility]

## 2015-03-26 ENCOUNTER — Ambulatory Visit (INDEPENDENT_AMBULATORY_CARE_PROVIDER_SITE_OTHER): Payer: Medicaid Other | Admitting: Family Medicine

## 2015-03-26 ENCOUNTER — Encounter: Payer: Self-pay | Admitting: Family Medicine

## 2015-03-26 VITALS — BP 144/88 | HR 70 | Temp 98.1°F | Ht 61.0 in | Wt 376.0 lb

## 2015-03-26 DIAGNOSIS — Z30013 Encounter for initial prescription of injectable contraceptive: Secondary | ICD-10-CM

## 2015-03-26 MED ORDER — MEDROXYPROGESTERONE ACETATE 150 MG/ML IM SUSP
150.0000 mg | Freq: Once | INTRAMUSCULAR | Status: AC
  Administered 2015-03-26: 150 mg via INTRAMUSCULAR

## 2015-03-26 NOTE — Progress Notes (Signed)
   Subjective:   Paula Drake is a 24 y.o. female presenting with intolerance of OCPs  She reports starting OCPs about 3 weeks ago. Since that time she reports a down mood. No prior h/o depression. She reports she does not plan/intend a pregnancy. She is sexually active.    Health Maintenance Due  Topic Date Due  . HIV Screening  06/02/2007  . TETANUS/TDAP  06/02/2011  . INFLUENZA VACCINE  11/27/2014   Review of Systems:   Per HPI. All other systems reviewed and are negative expect as in HPI   PMH, PSH, Medications, Allergies, SocialHx and FHx reviewed and updated in EMR- marked as reviewed 03/26/2015   Objective:  There were no vitals taken for this visit.  Gen:  24 y.o. female in NAD. Speaking in full sentences. Good eye contact CV: RR Resp: Normal WOB. Non-labored GI: Soft. obese Ext: WWP, no edema MSK: Intact gait. Full ROM Neuro: Alert and oriented, speech normal Pysch: Normal mood and affect.   Depression screen PHQ 2/9 03/26/2015  Decreased Interest 2  Down, Depressed, Hopeless 2  PHQ - 2 Score 4  Altered sleeping 2  Tired, decreased energy 2  Change in appetite 0  Feeling bad or failure about yourself  2  Trouble concentrating 0  Moving slowly or fidgety/restless 0  PHQ-9 Score 10  Difficult doing work/chores Very difficult    Assessment:     Paula Drake is a 24 y.o. female here for depression in the setting of new start of OCPs.     Plan:   #Contraceptive management -discussed options including changing OCP, progesterone only options (depo, nexplanon and IUD) -Reviewed risk/benefits and alternatives - Patient opted for depo and this was provided today.  - return q 3 for depo -discussed SE of depo including worsening mood sx which is less likely since she does not have preexisting depression, weight gain, irregular periods, fertility shadow on discontinuation, and bone density.   Federico FlakeKimberly Niles Domanic Matusek, MD, ABFM OB Fellow, Faculty  Practice 03/26/2015  2:44 PM

## 2015-04-07 ENCOUNTER — Telehealth: Payer: Self-pay | Admitting: Family Medicine

## 2015-04-07 NOTE — Telephone Encounter (Signed)
Pt calling in regard to painful period for the past 2 weeks, recently started on depo for birth control. Has upcoming appointment to discuss this issue on 12/15. Rec ibuprofen 600mg  q6 prn for pain control until then. Patient agrees.

## 2015-04-09 ENCOUNTER — Emergency Department (HOSPITAL_COMMUNITY)
Admission: EM | Admit: 2015-04-09 | Discharge: 2015-04-10 | Disposition: A | Discharge status: Home / Self Care | Payer: Medicaid Other | Attending: Emergency Medicine | Admitting: Emergency Medicine

## 2015-04-09 ENCOUNTER — Encounter (HOSPITAL_COMMUNITY): Payer: Self-pay | Admitting: Emergency Medicine

## 2015-04-09 DIAGNOSIS — Z87828 Personal history of other (healed) physical injury and trauma: Secondary | ICD-10-CM | POA: Insufficient documentation

## 2015-04-09 DIAGNOSIS — Z79818 Long term (current) use of other agents affecting estrogen receptors and estrogen levels: Secondary | ICD-10-CM | POA: Insufficient documentation

## 2015-04-09 DIAGNOSIS — E6609 Other obesity due to excess calories: Secondary | ICD-10-CM | POA: Insufficient documentation

## 2015-04-09 DIAGNOSIS — Z3202 Encounter for pregnancy test, result negative: Secondary | ICD-10-CM | POA: Insufficient documentation

## 2015-04-09 DIAGNOSIS — R109 Unspecified abdominal pain: Secondary | ICD-10-CM

## 2015-04-09 DIAGNOSIS — Z8719 Personal history of other diseases of the digestive system: Secondary | ICD-10-CM | POA: Insufficient documentation

## 2015-04-09 LAB — COMPREHENSIVE METABOLIC PANEL
ALBUMIN: 3.3 g/dL — AB (ref 3.5–5.0)
ALK PHOS: 60 U/L (ref 38–126)
ALT: 10 U/L — ABNORMAL LOW (ref 14–54)
ANION GAP: 7 (ref 5–15)
AST: 15 U/L (ref 15–41)
BUN: 10 mg/dL (ref 6–20)
CALCIUM: 8.8 mg/dL — AB (ref 8.9–10.3)
CO2: 23 mmol/L (ref 22–32)
Chloride: 108 mmol/L (ref 101–111)
Creatinine, Ser: 0.76 mg/dL (ref 0.44–1.00)
GFR calc non Af Amer: >60 mL/min (ref >60)
GLUCOSE: 95 mg/dL (ref 65–99)
POTASSIUM: 3.9 mmol/L (ref 3.5–5.1)
Sodium: 138 mmol/L (ref 135–145)
TOTAL PROTEIN: 7.5 g/dL (ref 6.5–8.1)
Total Bilirubin: 0.5 mg/dL (ref 0.3–1.2)

## 2015-04-09 LAB — URINE MICROSCOPIC-ADD ON

## 2015-04-09 LAB — CBC
HEMATOCRIT: 37 % (ref 36.0–46.0)
HEMOGLOBIN: 11.5 g/dL — AB (ref 12.0–15.0)
MCH: 23.4 pg — ABNORMAL LOW (ref 26.0–34.0)
MCHC: 31.1 g/dL (ref 30.0–36.0)
MCV: 75.2 fL — ABNORMAL LOW (ref 78.0–100.0)
Platelets: 311 10*3/uL (ref 150–400)
RBC: 4.92 MIL/uL (ref 3.87–5.11)
RDW: 18.4 % — ABNORMAL HIGH (ref 11.5–15.5)
WBC: 9.4 10*3/uL (ref 4.0–10.5)

## 2015-04-09 LAB — URINALYSIS, ROUTINE W REFLEX MICROSCOPIC
Glucose, UA: NEGATIVE mg/dL
Ketones, ur: 15 mg/dL — AB
NITRITE: NEGATIVE
Protein, ur: 100 mg/dL — AB
SPECIFIC GRAVITY, URINE: 1.024 (ref 1.005–1.030)
pH: 5.5 (ref 5.0–8.0)

## 2015-04-09 LAB — POC URINE PREG, ED: PREG TEST UR: NEGATIVE

## 2015-04-09 LAB — LIPASE, BLOOD: Lipase: 28 U/L (ref 11–51)

## 2015-04-09 NOTE — ED Provider Notes (Signed)
CSN: [id number]     Arrival date & time 04/09/15  1858 History   By signing my name below, I, Paula Drake, attest that this documentation has been prepared under the direction and in the presence of Shon Baton, MD.  Electronically Signed: Arlan Drake, ED Scribe. 04/09/2015. 12:01 AM.   Chief Complaint  Patient presents with  . Abdominal Pain   The history is provided by the patient. No language interpreter was used.    HPI Comments: Paula Drake is a 24 y.o. female without any pertinent past medical history who presents to the Emergency Department complaining of intermittent, ongoing lower abdominal pain x 3 weeks. Pain is described as cramping and rated 10/10. No aggravating or alleviating factors at this time. However, pt states pain came on after starting 2 different types of birth controls. Pt was switched from an oral contraceptive to her first dose of the Depo shot. OTC Ibuprofen attempted at home without any improvement for pain. No recent fever, chills, nausea, vomiting, diarrhea, constipation, chest pain, or shortness of breath. Ms. Telleria denies any concern for STDs this evening. No previous history of STDs. currently she is on her menstrual cycle.  Patient has seen her OB/GYN for this.  PCP: MCDIARMID,TODD D, MD    Past Medical History  Diagnosis Date  . Obesity   . Esophageal reflux 06/14/2012  . 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  There is anterior subluxation of the right humeral head from right glenohumeral joint.  IMPRESSION: Anterior subluxation of the right humeral head.    History reviewed. No pertinent past surgical history. Family History  Problem Relation Age of Onset  . Cancer Father 28    lung cancer, smoker   . Diabetes Neg Hx   . Hypertension Neg Hx    Social History   Substance Use Topics  . Smoking status: Never Smoker   . Smokeless tobacco: Never Used  . Alcohol Use: Yes   OB History    Gravida Para Term Preterm AB TAB SAB Ectopic Multiple Living   0              Review of Systems  Constitutional: Negative for fever and chills.  Respiratory: Negative for shortness of breath.   Cardiovascular: Negative for chest pain.  Gastrointestinal: Positive for abdominal pain. Negative for nausea, vomiting, diarrhea and constipation.  Genitourinary: Negative for dysuria.  Musculoskeletal: Negative for back pain.  Skin: Negative for rash.  Neurological: Negative for headaches.  Psychiatric/Behavioral: Negative for confusion.  All other systems reviewed and are negative.     Allergies  Review of patient's allergies indicates no known allergies.  Home Medications   Prior to Admission medications   Medication Sig Start Date End Date Taking? Authorizing Provider  ibuprofen (ADVIL,MOTRIN) 200 MG tablet Take 400 mg by mouth every 6 (six) hours as needed for headache.   Yes Historical Provider, MD  medroxyPROGESTERone (DEPO-PROVERA) 150 MG/ML injection Inject 150 mg into the muscle every 3 (three) months.   Yes Historical Provider, MD  naproxen (NAPROSYN) 500 MG tablet Take 1 tablet (500 mg total) by mouth 2 (two) times daily. 04/10/15   Shon Baton, MD   Triage Vitals: BP 152/91 mmHg  Pulse 70  Temp(Src) 98.8 F (37.1 C) (Oral)  Resp 18  Ht  (  1.575 m)  Wt 375 lb 5 oz (170.241 kg)  BMI 68.63 kg/m2  SpO2 98%  LMP 04/06/2015   Physical Exam  Constitutional: She is oriented to person, place, and time.  Morbidly obese, no acute distress  HENT:  Head: Normocephalic and atraumatic.  Cardiovascular: Normal rate, regular rhythm and normal heart sounds.   No murmur heard. Pulmonary/Chest: Effort normal and breath sounds normal. No respiratory distress. She has no wheezes.  Abdominal: Soft. Bowel sounds are normal. There is no tenderness.  There is no rebound and no guarding.  Genitourinary:  Limited by habitus, normal external vaginal exam, active bleeding noted, no cervical motion tenderness or adnexal tenderness  Neurological: She is alert and oriented to person, place, and time.  Skin: Skin is warm and dry.  Psychiatric: She has a normal mood and affect.  Nursing note and vitals reviewed.   ED Course  Procedures (including critical care time)  DIAGNOSTIC STUDIES: Oxygen Saturation is 98% on RA, Normal by my interpretation.    COORDINATION OF CARE: 11:53 PM- Will order Lipase, CMP, CBC, urinalysis, and urine pregnancy. Discussed treatment plan with pt at bedside and pt agreed to plan.     Labs Review Labs Reviewed  WET PREP, GENITAL - Abnormal; Notable for the following:    WBC, Wet Prep HPF POC MANY (*)    All other components within normal limits  COMPREHENSIVE METABOLIC PANEL - Abnormal; Notable for the following:    Calcium 8.8 (*)    Albumin 3.3 (*)    ALT 10 (*)    All other components within normal limits  CBC - Abnormal; Notable for the following:    Hemoglobin 11.5 (*)    MCV 75.2 (*)    MCH 23.4 (*)    RDW 18.4 (*)    All other components within normal limits  URINALYSIS, ROUTINE W REFLEX MICROSCOPIC (NOT AT [facility]) - Abnormal; Notable for the following:    Color, Urine RED (*)    APPearance TURBID (*)    Hgb urine dipstick LARGE (*)    Bilirubin Urine SMALL (*)    Ketones, ur 15 (*)    Protein, ur 100 (*)    Leukocytes, UA MODERATE (*)    All other components within normal limits  URINE MICROSCOPIC-ADD ON - Abnormal; Notable for the following:    Squamous Epithelial / LPF 0-5 (*)    Bacteria, UA FEW (*)    All other components within normal limits  URINE CULTURE  LIPASE, BLOOD  RPR  HIV ANTIBODY (ROUTINE TESTING)  POC URINE PREG, ED  GC/CHLAMYDIA PROBE AMP ([place]) NOT AT [facility]  WET PREP  (BD AFFIRM) ([place])    Imaging Review No results found. I have personally reviewed  and evaluated these images and lab results as part of my medical decision-making.   EKG Interpretation None      MDM   Final diagnoses:  Abdominal cramping    Patient presents with lower abdominal cramping 3 weeks. Currently bleeding and on her period. Denies any other symptoms. Nontoxic. Abdominal exam is benign. Vaginal exam is limited by body habitus but is relatively benign as well. Patient was tested and treated for STDs. Her other lab work is reassuring. Patient was given Toradol for pain. Discussed with patient starting naproxen twice a day at home for cramping. We'll have her follow up closely with her primary physician and GYN given ongoing nature pain. Given that the pain is been ongoing for the last 3  weeks with reassuring exam and lab work, doubt acute emergent process.  After history, exam, and medical workup I feel the patient has been appropriately medically screened and is safe for discharge home. Pertinent diagnoses were discussed with the patient. Patient was given return precautions.   I personally performed the services described in this documentation, which was scribed in my presence. The recorded information has been reviewed and is accurate.   Shon Baton, MD 04/10/15 [phone]

## 2015-04-09 NOTE — ED Notes (Signed)
Pt. reports intermittent hypogastric pain/cramping for 3 weeks , denies nausea or vomitting / no fever or diarrhea.

## 2015-04-10 LAB — RPR: RPR: NONREACTIVE

## 2015-04-10 LAB — HIV ANTIBODY (ROUTINE TESTING W REFLEX): HIV Screen 4th Generation wRfx: NONREACTIVE

## 2015-04-10 LAB — WET PREP, GENITAL
Clue Cells Wet Prep HPF POC: NONE SEEN
SPERM: NONE SEEN
Trich, Wet Prep: NONE SEEN
YEAST WET PREP: NONE SEEN

## 2015-04-10 LAB — GC/CHLAMYDIA PROBE AMP (~~LOC~~) NOT AT ARMC
CHLAMYDIA, DNA PROBE: NEGATIVE
Neisseria Gonorrhea: NEGATIVE

## 2015-04-10 MED ORDER — NAPROXEN 500 MG PO TABS: 500.0000 mg | ORAL_TABLET | Freq: Two times a day (BID) | ORAL | Status: DC

## 2015-04-10 MED ORDER — KETOROLAC TROMETHAMINE 60 MG/2ML IM SOLN
60.0000 mg | Freq: Once | INTRAMUSCULAR | Status: AC
  Administered 2015-04-10: 60 mg via INTRAMUSCULAR
  Filled 2015-04-10: qty 2

## 2015-04-10 MED ORDER — CEFTRIAXONE SODIUM 250 MG IJ SOLR
250.0000 mg | Freq: Once | INTRAMUSCULAR | Status: AC
  Administered 2015-04-10: 250 mg via INTRAMUSCULAR
  Filled 2015-04-10: qty 250

## 2015-04-10 MED ORDER — LIDOCAINE HCL (PF) 1 % IJ SOLN
INTRAMUSCULAR | Status: AC
  Administered 2015-04-10: 0.9 mL
  Filled 2015-04-10: qty 5

## 2015-04-10 MED ORDER — AZITHROMYCIN 250 MG PO TABS
1000.0000 mg | ORAL_TABLET | Freq: Once | ORAL | Status: AC
  Administered 2015-04-10: 1000 mg via ORAL
  Filled 2015-04-10: qty 4

## 2015-04-10 NOTE — Discharge Instructions (Signed)
You were seen today for abdominal pain. You need to follow-up with your GYN given that your pain seems to be related to recent birth control changes. If you have any new or worsening symptoms she should be reevaluated immediately.  Abdominal Pain, Adult Many things can cause abdominal pain. Usually, abdominal pain is not caused by a disease and will improve without treatment. It can often be observed and treated at home. Your health care provider will do a physical exam and possibly order blood tests and X-rays to help determine the seriousness of your pain. However, in many cases, more time must pass before a clear cause of the pain can be found. Before that point, your health care provider may not know if you need more testing or further treatment. HOME CARE INSTRUCTIONS Monitor your abdominal pain for any changes. The following actions may help to alleviate any discomfort you are experiencing:  Only take over-the-counter or prescription medicines as directed by your health care provider.  Do not take laxatives unless directed to do so by your health care provider.  Try a clear liquid diet (broth, tea, or water) as directed by your health care provider. Slowly move to a bland diet as tolerated. SEEK MEDICAL CARE IF:  You have unexplained abdominal pain.  You have abdominal pain associated with nausea or diarrhea.  You have pain when you urinate or have a bowel movement.  You experience abdominal pain that wakes you in the night.  You have abdominal pain that is worsened or improved by eating food.  You have abdominal pain that is worsened with eating fatty foods.  You have a fever. SEEK IMMEDIATE MEDICAL CARE IF:  Your pain does not go away within 2 hours.  You keep throwing up (vomiting).  Your pain is felt only in portions of the abdomen, such as the right side or the left lower portion of the abdomen.  You pass bloody or black tarry stools. MAKE SURE YOU:  Understand these  instructions.  Will watch your condition.  Will get help right away if you are not doing well or get worse.   This information is not intended to replace advice given to you by your health care provider. Make sure you discuss any questions you have with your health care provider.   Document Released: 01/22/2005 Document Revised: 01/03/2015 Document Reviewed: 12/22/2012 Elsevier Interactive Patient Education [employer] Inc[employer]

## 2015-04-10 NOTE — ED Notes (Signed)
Phlebotomy at bedside with the

## 2015-04-11 LAB — URINE CULTURE

## 2015-06-21 ENCOUNTER — Ambulatory Visit: Payer: Medicaid Other | Admitting: Family Medicine

## 2015-06-21 ENCOUNTER — Ambulatory Visit (INDEPENDENT_AMBULATORY_CARE_PROVIDER_SITE_OTHER): Payer: Medicaid Other | Admitting: *Deleted

## 2015-06-21 DIAGNOSIS — Z3042 Encounter for surveillance of injectable contraceptive: Secondary | ICD-10-CM | POA: Diagnosis not present

## 2015-06-21 MED ORDER — MEDROXYPROGESTERONE ACETATE 150 MG/ML IM SUSP
150.0000 mg | Freq: Once | INTRAMUSCULAR | Status: AC
  Administered 2015-06-21: 150 mg via INTRAMUSCULAR

## 2015-06-21 NOTE — Progress Notes (Signed)
Patient presented for depo injection.  Last received 03/26/15 and due between 06/11/15-06/25/15.  Injection given into right upper outer quadrant and patient tolerated well.  Next dose due 09/06/15-09/20/15.  Reminder card given to patient. Jazmin Hartsell,CMA

## 2015-09-11 ENCOUNTER — Ambulatory Visit (INDEPENDENT_AMBULATORY_CARE_PROVIDER_SITE_OTHER): Payer: Medicaid Other | Admitting: *Deleted

## 2015-09-11 DIAGNOSIS — Z3042 Encounter for surveillance of injectable contraceptive: Secondary | ICD-10-CM

## 2015-09-11 MED ORDER — MEDROXYPROGESTERONE ACETATE 150 MG/ML IM SUSP
150.0000 mg | Freq: Once | INTRAMUSCULAR | Status: AC
  Administered 2015-09-11: 150 mg via INTRAMUSCULAR

## 2015-09-11 NOTE — Progress Notes (Signed)
   Pt in for Depo Provera injection.  Pt tolerated Depo injection. Depo given left upper outer quadrant.  Next injection due July 31-December 10, 2015.  Reminder card given. Martin, Tamika L, RN  

## 2015-12-06 ENCOUNTER — Ambulatory Visit (INDEPENDENT_AMBULATORY_CARE_PROVIDER_SITE_OTHER): Payer: Medicaid Other | Admitting: *Deleted

## 2015-12-06 DIAGNOSIS — Z3042 Encounter for surveillance of injectable contraceptive: Secondary | ICD-10-CM | POA: Diagnosis present

## 2015-12-06 MED ORDER — MEDROXYPROGESTERONE ACETATE 150 MG/ML IM SUSP: 150.0000 mg | Freq: Once | INTRAMUSCULAR | Status: DC

## 2015-12-06 MED ORDER — MEDROXYPROGESTERONE ACETATE 150 MG/ML IM SUSY
150.0000 mg | PREFILLED_SYRINGE | Freq: Once | INTRAMUSCULAR | Status: AC
  Administered 2015-12-06: 150 mg via INTRAMUSCULAR

## 2015-12-06 NOTE — Progress Notes (Signed)
   Patient presents for Depo Provera injection States feeling well Last injection received 09/11/2015 Patient is on-time for injection. Depo Provera given IM RUOQ. Patient tolerated well. Next injection due Oct 26-Mar 06, 2016 Reminder card given Fredderick SeveranceUCATTE, Flynn Lininger L, RN

## 2016-02-25 ENCOUNTER — Ambulatory Visit (INDEPENDENT_AMBULATORY_CARE_PROVIDER_SITE_OTHER): Payer: Medicaid Other | Admitting: *Deleted

## 2016-02-25 DIAGNOSIS — Z3040 Encounter for surveillance of contraceptives, unspecified: Secondary | ICD-10-CM | POA: Diagnosis not present

## 2016-02-25 DIAGNOSIS — Z3042 Encounter for surveillance of injectable contraceptive: Secondary | ICD-10-CM

## 2016-02-25 MED ORDER — MEDROXYPROGESTERONE ACETATE 150 MG/ML IM SUSP
150.0000 mg | Freq: Once | INTRAMUSCULAR | Status: AC
  Administered 2016-02-25: 150 mg via INTRAMUSCULAR

## 2016-02-25 NOTE — Progress Notes (Signed)
   Pt in for Depo Provera injection.  Pt tolerated Depo injection. Depo given left upper outer quadrant.  Next injection due Jan. 15-29, 2018.  Reminder card given. Clovis PuMartin, Johnisha Louks L, RN

## 2016-02-27 ENCOUNTER — Ambulatory Visit: Payer: Medicaid Other

## 2016-05-16 ENCOUNTER — Ambulatory Visit (INDEPENDENT_AMBULATORY_CARE_PROVIDER_SITE_OTHER): Payer: Medicaid Other | Admitting: *Deleted

## 2016-05-16 DIAGNOSIS — Z3042 Encounter for surveillance of injectable contraceptive: Secondary | ICD-10-CM

## 2016-05-16 DIAGNOSIS — Z3040 Encounter for surveillance of contraceptives, unspecified: Secondary | ICD-10-CM | POA: Diagnosis present

## 2016-05-16 MED ORDER — MEDROXYPROGESTERONE ACETATE 150 MG/ML IM SUSP
150.0000 mg | Freq: Once | INTRAMUSCULAR | Status: AC
  Administered 2016-05-16: 150 mg via INTRAMUSCULAR

## 2016-05-16 NOTE — Progress Notes (Signed)
   Pt in for Depo Provera injection.  Pt tolerated Depo injection. Depo given right upper outer quadrant.  Next injection due April 6-20, 2018.  Reminder card given. Clovis PuMartin, Santonio Speakman L, RN

## 2016-08-08 ENCOUNTER — Ambulatory Visit (INDEPENDENT_AMBULATORY_CARE_PROVIDER_SITE_OTHER): Payer: Medicaid Other | Admitting: *Deleted

## 2016-08-08 DIAGNOSIS — Z3042 Encounter for surveillance of injectable contraceptive: Secondary | ICD-10-CM

## 2016-08-08 MED ORDER — MEDROXYPROGESTERONE ACETATE 150 MG/ML IM SUSY
150.0000 mg | PREFILLED_SYRINGE | Freq: Once | INTRAMUSCULAR | Status: AC
  Administered 2016-08-08: 150 mg via INTRAMUSCULAR

## 2016-08-08 NOTE — Progress Notes (Signed)
   Patient presents for Depo Provera injection States feeling well Last injection received 05/16/2016  Patient is within dates for injection  Depo Provera given IM LUOQ. Patient tolerated well.  Next injection due June 29-November 07, 2016 Reminder card given Patient advised to schedule appt with PCP for pap. Fredderick Severance, RN

## 2016-10-30 ENCOUNTER — Ambulatory Visit (INDEPENDENT_AMBULATORY_CARE_PROVIDER_SITE_OTHER): Payer: Self-pay | Admitting: *Deleted

## 2016-10-30 DIAGNOSIS — Z3042 Encounter for surveillance of injectable contraceptive: Secondary | ICD-10-CM

## 2016-10-30 MED ORDER — MEDROXYPROGESTERONE ACETATE 150 MG/ML IM SUSP
150.0000 mg | Freq: Once | INTRAMUSCULAR | Status: AC
  Administered 2016-10-30: 150 mg via INTRAMUSCULAR

## 2016-10-30 NOTE — Progress Notes (Signed)
   Pt in for Depo Provera injection.  Pt tolerated Depo injection. Depo given right upper outer quadrant.  Next injection due Sept. 20-Oct. 4, 2018.  Reminder card given. Clovis PuMartin, Kendahl Bumgardner L, RN

## 2017-01-19 ENCOUNTER — Ambulatory Visit (INDEPENDENT_AMBULATORY_CARE_PROVIDER_SITE_OTHER): Payer: Self-pay | Admitting: *Deleted

## 2017-01-19 DIAGNOSIS — Z3042 Encounter for surveillance of injectable contraceptive: Secondary | ICD-10-CM

## 2017-01-19 MED ORDER — MEDROXYPROGESTERONE ACETATE 150 MG/ML IM SUSP
150.0000 mg | Freq: Once | INTRAMUSCULAR | Status: AC
  Administered 2017-01-19: 150 mg via INTRAMUSCULAR

## 2017-01-19 NOTE — Progress Notes (Signed)
   Pt in for Depo Provera injection.  Pt tolerated Depo injection. Depo given left upper outer quadrant.  Next injection due December 10-24, 2018.  Reminder card given. [place]is Pu, RN

## 2017-04-17 ENCOUNTER — Ambulatory Visit: Payer: Self-pay | Admitting: *Deleted

## 2017-04-17 DIAGNOSIS — Z3042 Encounter for surveillance of injectable contraceptive: Secondary | ICD-10-CM

## 2017-04-17 MED ORDER — MEDROXYPROGESTERONE ACETATE 150 MG/ML IM SUSY
150.0000 mg | PREFILLED_SYRINGE | Freq: Once | INTRAMUSCULAR | Status: AC
  Administered 2017-04-17: 150 mg via INTRAMUSCULAR

## 2017-04-17 NOTE — Progress Notes (Signed)
   Patient presents for Depo Provera injection States feeling well Last injection received 01/19/2017  Patient is within dates for injection  Depo Provera given IM RUOQ. Patient tolerated well.  Next injection due March 8-22, 2019 Reminder card given Fredderick SeveranceUCATTE, Zakarie Sturdivant L, RN

## 2017-05-28 ENCOUNTER — Encounter: Payer: Self-pay | Admitting: Family Medicine

## 2017-05-28 ENCOUNTER — Ambulatory Visit (INDEPENDENT_AMBULATORY_CARE_PROVIDER_SITE_OTHER): Payer: Self-pay | Admitting: Family Medicine

## 2017-05-28 ENCOUNTER — Other Ambulatory Visit: Payer: Self-pay

## 2017-05-28 ENCOUNTER — Ambulatory Visit: Payer: Self-pay

## 2017-05-28 VITALS — HR 74 | Temp 98.7°F | Wt 392.0 lb

## 2017-05-28 DIAGNOSIS — J029 Acute pharyngitis, unspecified: Secondary | ICD-10-CM

## 2017-05-28 DIAGNOSIS — J209 Acute bronchitis, unspecified: Secondary | ICD-10-CM

## 2017-05-28 DIAGNOSIS — B349 Viral infection, unspecified: Secondary | ICD-10-CM

## 2017-05-28 LAB — POCT RAPID STREP A (OFFICE): RAPID STREP A SCREEN: NEGATIVE

## 2017-05-28 MED ORDER — BENZONATATE 200 MG PO CAPS: 200.0000 mg | ORAL_CAPSULE | Freq: Two times a day (BID) | ORAL | 0 refills | Status: DC | PRN

## 2017-05-28 MED ORDER — GUAIFENESIN-CODEINE 100-10 MG/5ML PO SOLN: 10.0000 mL | Freq: Every evening | ORAL | 0 refills | Status: DC | PRN

## 2017-05-28 NOTE — Patient Instructions (Addendum)
Get home and rest over the weekend   Drink plenty of fluids Use Tylenol or Ibuprofen for sore throat.   Use Tessalon Pearls every 8 hours as needed for cough during day.   Use Robitussin AC 10 ml at bedtime for cough as needed.     Acute Bronchitis, Adult Acute bronchitis is when air tubes (bronchi) in the lungs suddenly get swollen. The condition can make it hard to breathe. It can also cause these symptoms:  A cough.  Coughing up clear, yellow, or green mucus.  Wheezing.  Chest congestion.  Shortness of breath.  A fever.  Body aches.  Chills.  A sore throat.  Follow these instructions at home: Medicines  Take over-the-counter and prescription medicines only as told by your doctor.  If you were prescribed an antibiotic medicine, take it as told by your doctor. Do not stop taking the antibiotic even if you start to feel better. General instructions  Rest.  Drink enough fluids to keep your pee (urine) clear or pale yellow.  Avoid smoking and secondhand smoke. If you smoke and you need help quitting, ask your doctor. Quitting will help your lungs heal faster.  Use an inhaler, cool mist vaporizer, or humidifier as told by your doctor.  Keep all follow-up visits as told by your doctor. This is important. How is this prevented? To lower your risk of getting this condition again:  Wash your hands often with soap and water. If you cannot use soap and water, use hand sanitizer.  Avoid contact with people who have cold symptoms.  Try not to touch your hands to your mouth, nose, or eyes.  Make sure to get the flu shot every year.  Contact a doctor if:  Your symptoms do not get better in 2 weeks. Get help right away if:  You cough up blood.  You have chest pain.  You have very bad shortness of breath.  You become dehydrated.  You faint (pass out) or keep feeling like you are going to pass out.  You keep throwing up (vomiting).  You have a very bad  headache.  Your fever or chills gets worse. This information is not intended to replace advice given to you by your health care provider. Make sure you discuss any questions you have with your health care provider. Document Released: 10/01/2007 Document Revised: 11/21/2015 Document Reviewed: 10/03/2015 Elsevier Interactive Patient Education  [employer]2018 Elsevier Inc.

## 2017-05-29 ENCOUNTER — Encounter: Payer: Self-pay | Admitting: Family Medicine

## 2017-05-29 NOTE — Progress Notes (Signed)
SORE THROAT   Onset: 3 days ago  Severity: moderately severe Better with: no change with APAP nor Ibuprofen 400 mg prn  Symptoms  Fever: subjective fever and chills    Cough/URI sxs: yes Myalgias: yes Headache: yes Rash: no Swollen neck glands: yes    Recent Strep Exposure: no LUQ pain: no Heartburn/brash: no Allergy Sxs: rhinorhhea  Red Flags STD exposure: no Breathing difficulty: no Drooling: no Trismus: no  : no smoking  Vitals:   05/28/17 1103  Pulse: 74  Temp: 98.7 F (37.1 C)   VS reviewed GEN: Alert, Cooperative, Groomed, NAD HEENT: PERRL; EAC bilaterally not occluded, TM's translucent with normal LM, (+) LR;                Slight tender right cervical ln, No thyromegaly, No palpable masses,  oropharngeal tonsilar erythema without exudate bilateral COR: RRR, No M/G/R, No JVD, Normal PMI size and location LUNGS: BCTA, No Acc mm use, speaking in full sentences ABDOMEN: (+)BS, soft, NT, ND, No HSM, No palpable masses Gait: Normal speed, No significant path deviation, Step through +,  Psych: Normal affect/thought/speech/language  A/P  Viral Pharyngitis - possible ILI - too late for antiviral intervention - Symptomatic care, cough tessalon pearl prn daytime, Robitussin AC qhs prn nightime cough - Rest out of work thru 06/01/17.  - RTC if worsens or fails to improve within a week.

## 2017-07-10 ENCOUNTER — Ambulatory Visit (INDEPENDENT_AMBULATORY_CARE_PROVIDER_SITE_OTHER): Payer: Self-pay

## 2017-07-10 DIAGNOSIS — Z3042 Encounter for surveillance of injectable contraceptive: Secondary | ICD-10-CM

## 2017-07-10 MED ORDER — MEDROXYPROGESTERONE ACETATE 150 MG/ML IM SUSY
150.0000 mg | PREFILLED_SYRINGE | Freq: Once | INTRAMUSCULAR | Status: AC
  Administered 2017-07-10: 150 mg via INTRAMUSCULAR

## 2017-07-10 NOTE — Progress Notes (Signed)
Pt here today for Depo Provera injection. Depo given today LUOA. Site unremarkable and patient tolerated well. Next depo due May 31- October 09, 2017. Shawna OrleansMeredith B Cresencia Asmus, RN

## 2017-10-09 ENCOUNTER — Ambulatory Visit (INDEPENDENT_AMBULATORY_CARE_PROVIDER_SITE_OTHER): Payer: Self-pay

## 2017-10-09 DIAGNOSIS — Z3042 Encounter for surveillance of injectable contraceptive: Secondary | ICD-10-CM

## 2017-10-09 MED ORDER — MEDROXYPROGESTERONE ACETATE 150 MG/ML IM SUSY
150.0000 mg | PREFILLED_SYRINGE | Freq: Once | INTRAMUSCULAR | Status: AC
  Administered 2017-10-09: 150 mg via INTRAMUSCULAR

## 2017-10-09 NOTE — Progress Notes (Signed)
   Patient in to nurse clinic within dates for Depo-Provera. Injection given RUOQ. Next injection due 12/25/17-01/08/18. Reminder card given. Ples SpecterAlisa Atleigh Gruen, RN [facility] RN)

## 2018-01-14 ENCOUNTER — Ambulatory Visit (INDEPENDENT_AMBULATORY_CARE_PROVIDER_SITE_OTHER): Payer: Self-pay

## 2018-01-14 DIAGNOSIS — Z3042 Encounter for surveillance of injectable contraceptive: Secondary | ICD-10-CM

## 2018-01-14 LAB — POCT URINE PREGNANCY: PREG TEST UR: NEGATIVE

## 2018-01-14 MED ORDER — MEDROXYPROGESTERONE ACETATE 150 MG/ML IM SUSY
150.0000 mg | PREFILLED_SYRINGE | Freq: Once | INTRAMUSCULAR | Status: AC
  Administered 2018-01-14: 150 mg via INTRAMUSCULAR

## 2018-01-14 NOTE — Progress Notes (Signed)
   Patient in to nurse clinic not within dates for Depo-Provera injection. Urine pregnancy test negative. Injection given LUOQ. Next injection due 04/01/18-04/15/18. Reminder card given. Ples SpecterAlisa Brake, RN [facility] RN)

## 2018-05-10 ENCOUNTER — Ambulatory Visit: Payer: Self-pay

## 2018-05-12 ENCOUNTER — Ambulatory Visit: Payer: Self-pay

## 2018-06-25 ENCOUNTER — Ambulatory Visit: Payer: Self-pay | Admitting: *Deleted

## 2018-06-25 DIAGNOSIS — Z3042 Encounter for surveillance of injectable contraceptive: Secondary | ICD-10-CM

## 2018-06-25 LAB — POCT URINE PREGNANCY: Preg Test, Ur: NEGATIVE

## 2018-06-25 MED ORDER — MEDROXYPROGESTERONE ACETATE 150 MG/ML IM SUSY
150.0000 mg | PREFILLED_SYRINGE | Freq: Once | INTRAMUSCULAR | Status: AC
  Administered 2018-06-25: 150 mg via INTRAMUSCULAR

## 2018-06-25 NOTE — Progress Notes (Signed)
Patient here today for Depo Provera injection.  Depo was due 04/02/19 - 04/16/19 and is late for Depo.    Obtained urine pregnancy test today and is neg.  Patient states last sexual activity was one week ago and did not use protection.  Pt thought that the "pull out method" was reliable and states there is no way she is pregnant.  Discussed how this is not a reliable method and another form of birth control should be used. Pt chooses to have depo today and return in 2 weeks for repeat upreg.  Advise to refrain from sex for the next two weeks.  Depo given today in RUOQ.  Site unremarkable & patient tolerated injection.  Next injection due May 16 - May30 and reminder card given.  Patient instructed that next depo needs to be in an appt with provider since her last was almost one year ago.  She will call and make physical within the dates of 09/11/18 - 09/25/18.    Fleeger, Maryjo Rochester, CMA

## 2018-07-08 ENCOUNTER — Ambulatory Visit: Payer: Self-pay

## 2019-12-05 ENCOUNTER — Encounter: Payer: Self-pay | Admitting: Family Medicine

## 2019-12-05 NOTE — Progress Notes (Deleted)
    SUBJECTIVE:   CHIEF COMPLAINT / HPI: Physical  Ms. Devincenzi is a 29 year old female presenting for an annual physical.  Occupation: Exercise: Diet: Mood:  GYN: G0P0 Contraception: Menstrual cycles: Desire for STD screening:  Pap smear: Last Pap smear in 2014, unable to see results.  Family history: No family history of sudden death or early CAD, no family history of colon or breast cancer.  Health maintenance: Due for Covid vaccine, hepatitis C screening, and Tdap   PERTINENT  PMH / PSH: Elevated BMI, elevated blood pressure without hypertension diagnosis  OBJECTIVE:   There were no vitals taken for this visit.  ***  ASSESSMENT/PLAN:   No problem-specific Assessment & Plan notes found for this encounter.     Allayne Stack, DO [facility]

## 2020-06-28 ENCOUNTER — Ambulatory Visit: Payer: 59 | Admitting: Family Medicine

## 2020-06-28 ENCOUNTER — Other Ambulatory Visit: Payer: Self-pay

## 2020-06-28 ENCOUNTER — Encounter: Payer: Self-pay | Admitting: Family Medicine

## 2020-06-28 VITALS — BP 120/58 | HR 101 | Ht 62.0 in | Wt 342.0 lb

## 2020-06-28 DIAGNOSIS — N939 Abnormal uterine and vaginal bleeding, unspecified: Secondary | ICD-10-CM

## 2020-06-28 DIAGNOSIS — F321 Major depressive disorder, single episode, moderate: Secondary | ICD-10-CM

## 2020-06-28 DIAGNOSIS — F5102 Adjustment insomnia: Secondary | ICD-10-CM | POA: Diagnosis not present

## 2020-06-28 LAB — POCT URINE PREGNANCY: Preg Test, Ur: NEGATIVE

## 2020-06-28 MED ORDER — ESCITALOPRAM OXALATE 20 MG PO TABS: 20.0000 mg | ORAL_TABLET | Freq: Every day | ORAL | 2 refills | Status: DC

## 2020-06-28 MED ORDER — TRAZODONE HCL 50 MG PO TABS: 25.0000 mg | ORAL_TABLET | Freq: Every evening | ORAL | 3 refills | Status: DC | PRN

## 2020-06-28 NOTE — Patient Instructions (Signed)
I am concerned that you are in a major depression. Counseling and medications for depression are the best way to handle depression  Please tre the antidepressant medication called escitalopram (Lexapro).  Take one tablet each morning.  It will take at least 4 weeks to start to have its effect.  Please try not to stop the escitalopram until you have been taking it for those four weeks.  Hopefully you will need to take the escitalopram for only 3 to 4 months.   Start taking trazodone an hour before bedtime to help your fall asleep and stay asleep.  Start with a half tablet at bedtime.  If you feel this is not enough, take a whole tablet before bedtime.   Contact your insurance first to see which counselors for which they provide insurance coverage.  Below is a list of counselors in our area.    Therapy and Counseling Resources Most providers on this list will take Medicaid. Patients with commercial insurance or Medicare should contact their insurance company to get a list of in network providers.  [facility]:Psychiatry and Counseling [address] [place], [place] [address] [phone]  [facility]  [address], Suite [place], [place] [address]      [phone]  [facility] [address]  [place], [place] [address] [phone]  [facility] [address], [address]  [place], [place] [address]       [phone]      Jovita Kussmaul [facility] [address] [place], [place] [phone]  [facility]:  [address] [place] [phone]  [facility]:  [address] [place] Diener [phone]  [facility] (under & uninsured) [address]   [place] [phone]    [email or web address]    [facility] [address] . [place]    [phone]  [facility] -[address]     Phone:  [phone]     [facility]-  [address]  [phone]   [facility] [address]      [place], [place] [phone]  [facility]: [address] [place], [place], [place]  [phone]   [facility] (Spanish therapist) [email or web address].[email or web address]  [address] [place]  [address]   [place] [address]    [phone]    [facility]   [address],  [place], [place]  [phone]   [facility]  [address] [place]  [phone]  [facility]  [address] [place], [place]        [phone]  Open Access/Walk In Clinic under & uninsured  [facility]  [address] [place], [place] [phone] Crisis [phone]  [facility] [place],  (Spanish)   [address], [place]: [phone]) 8:30 - 12; 1 - 2:30  [facility] [employer],  [address] [place], [place]    ([phone]):8:30 - 12; 2 - 3PM  [facility] [employer],  [address],  [place]; [phone]):   Mon - Fri 8 AM - 5 PM  [facility] [address] [place]  MWF 12:30 to 3:00 or call to schedule an appointment  [phone]  Specific Provider options Psychology Today  https://www.psychologytoday.com/us 1. click on  find a therapist  2. enter your zip code 3. left side and select or tailor a therapist for your specific need.   [facility] Provider Directory [email or web address]  (Medicaid)   Follow all drop down to find a provider  Social Support program [facility] [place] [phone] or PhotoSolver.pl [address], [place], [place] Recovery support and educational   24- Hour Availability:  .  [facility]  . [address] [place], [place] [employer] [phone] Crisis [phone]  . Family Service of the  [employer] [phone]  [facility] Crisis Service  [phone]   . [facility] [employer]  [phone] (after hours)  . Therapeutic Alternative/Mobile Crisis   [phone]  . [place] National Suicide Hotline  9806260365 (TALK)  . Call 911 or go to emergency room  . [employer]  [phone]);  [place] and [employer]   . [facility]  [phone]); [place], [place], [place], [place], [place], [place], [place]

## 2020-06-28 NOTE — Progress Notes (Signed)
u

## 2020-06-29 ENCOUNTER — Telehealth: Payer: Self-pay | Admitting: *Deleted

## 2020-06-29 ENCOUNTER — Encounter: Payer: Self-pay | Admitting: Family Medicine

## 2020-06-29 DIAGNOSIS — F321 Major depressive disorder, single episode, moderate: Secondary | ICD-10-CM

## 2020-06-29 DIAGNOSIS — N939 Abnormal uterine and vaginal bleeding, unspecified: Secondary | ICD-10-CM | POA: Insufficient documentation

## 2020-06-29 DIAGNOSIS — F5102 Adjustment insomnia: Secondary | ICD-10-CM | POA: Insufficient documentation

## 2020-06-29 DIAGNOSIS — F5105 Insomnia due to other mental disorder: Secondary | ICD-10-CM | POA: Insufficient documentation

## 2020-06-29 HISTORY — DX: Abnormal uterine and vaginal bleeding, unspecified: N93.9

## 2020-06-29 HISTORY — DX: Major depressive disorder, single episode, moderate: F32.1

## 2020-06-29 HISTORY — DX: Insomnia due to other mental disorder: F51.05

## 2020-06-29 LAB — CMP14+EGFR
ALT: 9 IU/L (ref 0–32)
AST: 20 IU/L (ref 0–40)
Albumin/Globulin Ratio: 1.4 (ref 1.2–2.2)
Albumin: 4.3 g/dL (ref 3.9–5.0)
Alkaline Phosphatase: 71 IU/L (ref 44–121)
BUN/Creatinine Ratio: 10 (ref 9–23)
BUN: 9 mg/dL (ref 6–20)
Bilirubin Total: 0.3 mg/dL (ref 0.0–1.2)
CO2: 19 mmol/L — ABNORMAL LOW (ref 20–29)
Calcium: 9.4 mg/dL (ref 8.7–10.2)
Chloride: 103 mmol/L (ref 96–106)
Creatinine, Ser: 0.9 mg/dL (ref 0.57–1.00)
Globulin, Total: 3 g/dL (ref 1.5–4.5)
Glucose: 95 mg/dL (ref 65–99)
Potassium: 4.1 mmol/L (ref 3.5–5.2)
Sodium: 139 mmol/L (ref 134–144)
Total Protein: 7.3 g/dL (ref 6.0–8.5)
eGFR: 89 mL/min/{1.73_m2} (ref >59)

## 2020-06-29 LAB — CBC
Hematocrit: 44.3 % (ref 34.0–46.6)
Hemoglobin: 14.2 g/dL (ref 11.1–15.9)
MCH: 28.6 pg (ref 26.6–33.0)
MCHC: 32.1 g/dL (ref 31.5–35.7)
MCV: 89 fL (ref 79–97)
Platelets: 209 10*3/uL (ref 150–450)
RBC: 4.96 x10E6/uL (ref 3.77–5.28)
RDW: 13.2 % (ref 11.7–15.4)
WBC: 7.5 10*3/uL (ref 3.4–10.8)

## 2020-06-29 LAB — TSH: TSH: 0.877 u[IU]/mL (ref 0.450–4.500)

## 2020-06-29 NOTE — Progress Notes (Signed)
Patient ID: Paula Drake, female   DOB: [birth year], 30 y.o.   MRN: [id number] Paula Drake is alone Sources of clinical information for visit is/are patient and past medical records. Nursing assessment for this office visit was reviewed with the patient for accuracy and revision.     Previous Report(s) Reviewed: historical medical records  Depression screen [facility] 2/9 06/28/2020  Decreased Interest 1  Down, Depressed, Hopeless 1  PHQ - 2 Score 2  Altered sleeping 1  Tired, decreased energy 1  Change in appetite 0  Feeling bad or failure about yourself  3  Trouble concentrating 3  Moving slowly or fidgety/restless 0  PHQ-9 Score 10  Difficult doing work/chores Extremely dIfficult    Fall Risk  01/19/2017 10/30/2016 05/16/2016 09/11/2015 10/11/2014  Falls in the past year? No No No No No    PHQ9 SCORE ONLY 06/28/2020 05/28/2017 01/19/2017  PHQ-9 Total Score 10 0 0    Adult vaccines due  Topic Date Due  . TETANUS/TDAP  11/03/2017    Health Maintenance Due  Topic Date Due  . Hepatitis C Screening  Never done  . PAP-Cervical Cytology Screening  Never done  . PAP SMEAR-Modifier  06/02/2015  . TETANUS/TDAP  11/03/2017      History/P.E. limitations: none  Adult vaccines due  Topic Date Due  . TETANUS/TDAP  11/03/2017   There are no preventive care reminders to display for this patient.  Health Maintenance Due  Topic Date Due  . Hepatitis C Screening  Never done  . PAP-Cervical Cytology Screening  Never done  . PAP SMEAR-Modifier  06/02/2015  . TETANUS/TDAP  11/03/2017     Chief Complaint  Patient presents with  . Annual Exam  . Vaginal Bleeding    While Ms Paula Drake's entering chief complaint is vaginal bleeding, this was not thought to be urgent and was displaced by her depressive symptoms complaint.   History of Present Illness:   Associated Signs/Symptoms: Depression Symptoms:  depressed mood, insomnia, fatigue, feelings of worthlessness/guilt, difficulty  concentrating, hopelessness, recurrent thoughts of death,   (Hypo) Manic Symptoms:  Denies  Mood swings affecting the patient's ability to function, Racing thoughts and Engaging in risky behavior (i.e., excessive spending, promiscuity, etc.)   Anxiety Symptoms:   Denies dizziness, feelings of losing control, palpitations, panic attacks and shortness of breath  Psychotic Symptoms:   None noted (Alteration in thought process): paranoia, inability to care for self, withdrawal, poor personal hygiene, hallucinations and delusions  PTSD Symptoms: Negative  Stressors - 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  has previously been in counseling recently thru her workplace.  She did not find the counseling helpful.  Previous antidepressant med therapy: none Previous anxiolytic med therapy: none Prior antipsychotic med therapy: none    Is the patient at risk to self? no    Has the patient been a risk to self in the past 6 months? no  Has the patient been a risk to self within the distant past? no     Is the patient a risk to others? no      Prior Inpatient Therapy: no    Prior Outpatient Therapy: no     Alcohol Screening: Patient does not drink  Additional Social History: Marital status: single, never married   Substance use: Smoking marijuana nightly in order to be able to fall asleep.    Physcial exam Appearance:  well groomed, wearing mask Eye Contact: good Attitude: neutral Speech: Spontaneous normal, normal Rate, normal Tone,  normal Volume,  No significant Latency  Mood: sad Affect:  Congruent; at times tearful Thought Process:  Linear &  Goal Directed  Thought Content: Denies SI/HI; no apparent delusions.  Insight:Good -  Judgement: Good Cognition: Intact

## 2020-06-29 NOTE — Assessment & Plan Note (Signed)
New problem Checking biophysical labs and TSH to look for contributory medical illnesses  Ms Grega is willing to start counseling.  List of community counselors provided.  Will ask our chronic care management team to help Ms Wiles follow thru with making contact with a counselor and starting and continuing her new antidepressant medication.  Ms Manz is willing to start an antidepressant medication.  She denies ever having taken an antidepressant.  Rx escitalopram 20 mg daily - plan to see Ms Miranda back in 4 weeks to assess response

## 2020-06-29 NOTE — Assessment & Plan Note (Signed)
New problem Insomnia related to her new Dx of MDD Sleep hygiene reviewed.  Rx Trazodone 25-50 mg qhs prn sleep Follow up in 4 weeks to assess tolerance and efficacy

## 2020-06-29 NOTE — Chronic Care Management (AMB) (Signed)
  Care Management   Note  06/29/2020 Name: AZAELA CARACCI MRN: [id number] DOB: Oct 22, 1990  TALEEYA BLONDIN is a 30 y.o. year old female who is a primary care patient of McDiarmid, Leighton Roach, MD. I reached out to Maurine Simmering by phone today in response to a referral sent by Ms. Sharon Seller PCP, McDiarmid, Leighton Roach, MD.  Ms. Luera was given information about care management services today including:  1. Care management services include personalized support from designated clinical staff supervised by her physician, including individualized plan of care and coordination with other care providers 2. 24/7 contact phone numbers for assistance for urgent and routine care needs. 3. The patient may stop care management services at any time by phone call to the office staff.  Patient agreed to services and verbal consent obtained.   Follow up plan: Telephone appointment with care management team member scheduled for:07/05/2020  [facility] Guide, Embedded Care Coordination [facility] Management

## 2020-06-29 NOTE — Assessment & Plan Note (Addendum)
Relevant PMH:  01/31/14: MAU visit. CC: vaginal bleeding. G0P0. Excess flow 4 pads/tampon per day. Assocaited Headaches. 2 months duration with daily bleed. FeSo4 makes nauseous.  History irregular menses - may go 2 months w/o bleed then bleed for a month.   Dx: DUB Rx FeSo4 1 daily and Megace 20 mg tab ( 2 tab TID x 3d; 2tab BID x 3 day; 2 tab daily x 3 day) and Rx BC Headache powdrs  [facility]07/[facility]: [facility].  CC: Contraceptive counseling. Hx irregular periods 2 days to 2 weeks.  Rx Sprintec.  Ms Villa is to rtc 4 weeks for work-up of this complaint.   CBC:    Component Value Date/Time   WBC 7.5 06/28/2020 1002   WBC 9.4 04/09/2015 2000   HGB 14.2 06/28/2020 1002   HCT 44.3 06/28/2020 1002   PLT 209 06/28/2020 1002   MCV 89 06/28/2020 1002   NEUTROABS 5.6 01/09/2014 1213   LYMPHSABS 2.2 01/09/2014 1213   MONOABS 0.8 01/09/2014 1213   EOSABS 0.4 01/09/2014 1213   BASOSABS 0.0 01/09/2014 1213   Upreg negative

## 2020-07-02 ENCOUNTER — Encounter: Payer: Self-pay | Admitting: Family Medicine

## 2020-07-05 ENCOUNTER — Telehealth: Payer: 59

## 2020-07-05 ENCOUNTER — Ambulatory Visit: Payer: 59 | Admitting: Licensed Clinical Social Worker

## 2020-07-05 DIAGNOSIS — Z139 Encounter for screening, unspecified: Secondary | ICD-10-CM

## 2020-07-05 DIAGNOSIS — Z7189 Other specified counseling: Secondary | ICD-10-CM

## 2020-07-05 NOTE — Patient Instructions (Signed)
  Ms. Vinzant  it was nice speaking with you. Please call me directly [phone] if you have questions about the goals we discussed. Goals Addressed            This Visit's Progress   . Begin and Stick with Counseling-Depression       Timeframe:  Short-Term Goal Priority:  High Start Date:  07/05/20                           Expected End Date:  04/31/22                     Follow Up Date 07/13/20  Patient Goals/Self-Care Activities: Over the next 30 days . Implement interventions discussed today to decreases symptoms of depression, insomnia, and stress and increase knowledge and/or ability of: coping skills, healthy habits, and stress reduction. . Call the therapist we discussed today to schedule an appointment  . Review EMMI educational information (insomnia and getting a good night's sleep and depression) . Review sleep hygiene e-mailed and practice relaxed breathing 3 times daily   Why is this important?    Beating depression may take some time.   If you don't feel better right away, don't give up on your treatment plan.       Ms. Mooneyhan received Care Coordination services today:  1. Care Coordination services include personalized support from designated clinical staff supervised by her physician, including individualized plan of care and coordination with other care providers 2. 24/7 contact [phone] for assistance for urgent and routine care needs. 3. Care Coordination are voluntary services and be declined at any time by calling the office.  Patient verbalizes understanding of instructions provided today.    Follow up plan: Appointment scheduled for SW follow up with client by phone on: 07/13/20  Soundra Pilon, LCSW

## 2020-07-05 NOTE — Chronic Care Management (AMB) (Signed)
Care Management Clinical Social Work Note  07/05/2020 Name: Paula Drake MRN: [id number] DOB: December 23, 1990  Paula Drake is a 30 y.o. year old female who is a primary care patient of McDiarmid, Leighton Roach, MD.  The Care Management team was consulted for assistance with chronic disease management and coordination needs.  Engaged with patient by telephone for initial visit in response to provider referral for social work chronic care management and care coordination services  Consent to Services:  Ms. Ordway was given information about Care Management services today including:  1. Care Management services includes personalized support from designated clinical staff supervised by her physician, including individualized plan of care and coordination with other care providers 2. 24/7 contact phone numbers for assistance for urgent and routine care needs. 3. The patient may stop case management services at any time by phone call to the office staff. Patient agreed to services and consent obtained.   Assessment: Patient is currently experiencing symptoms of  depression  which seems to be exacerbated by life transitions. (See Care Plan below interventions during this encounter and patient self-care actives).  Recent life changes Paula Drake: death of family member, relationship ending, loss of friends, increased isolation working remote with COVID, changing jobs, financial difficulties of not being able to pay bills, applying for public assistance and moving in with mother next month.   Recommendation: Patient may benefit from, and is in agreement to start counseling in addition to medication prescribed by PCP.  She is temp. out of work due to not being able to focus and may also benefit from additional time out of work.  Patient will discuss this with PCP.   Follow up Plan: Patient will call to schedule counseling appoint and implement interventions discussed today.  Patient would like continued follow-up.  CCM  LCSW will f/u with paitent 07/13/20. Patient will call office if needed prior to next encounter    Review of patient past medical history, allergies, medications, and health status, including review of relevant consultants reports was performed today as part of a comprehensive evaluation and provision of chronic care management and care coordination services.  SDOH (Social Determinants of Health) assessments and interventions performed:  SDOH Interventions   Flowsheet Row Most Recent Value  SDOH Interventions   Financial Strain Interventions Intervention Not Indicated  Stress Interventions Provide Counseling       Advanced Directives Status: Not addressed in this encounter.  Care Plan  No Known Allergies  Outpatient Encounter Medications as of 07/05/2020  Medication Sig  . benzonatate (TESSALON) 200 MG capsule Take 1 capsule (200 mg total) by mouth 2 (two) times daily as needed for cough.  . escitalopram (LEXAPRO) 20 MG tablet Take 1 tablet (20 mg total) by mouth daily.  Marland Kitchen guaiFENesin-codeine 100-10 MG/5ML syrup Take 10 mLs by mouth at bedtime as needed for cough.  Marland Kitchen ibuprofen (ADVIL,MOTRIN) 200 MG tablet Take 400 mg by mouth every 6 (six) hours as needed for headache.  . medroxyPROGESTERone (DEPO-PROVERA) 150 MG/ML injection Inject 150 mg into the muscle every 3 (three) months.  . naproxen (NAPROSYN) 500 MG tablet Take 1 tablet (500 mg total) by mouth 2 (two) times daily.  . traZODone (DESYREL) 50 MG tablet Take 0.5-1 tablets (25-50 mg total) by mouth at bedtime as needed for sleep.   No facility-administered encounter medications on file as of 07/05/2020.    Patient Active Problem List   Diagnosis Date Noted  . Depression, major, single episode, moderate (HCC) 06/29/2020  .  Adjustment insomnia 06/29/2020  . Vaginal bleeding 06/29/2020  . Contraception 05/17/2013  . OBESITY, MORBID 11/04/2007  . ELEVATED BLOOD PRESSURE 11/04/2007    Conditions to be addressed/monitored:  Depression; Social Isolation  Care Plan : Clinical Social Work  Updates made by Soundra Pilon, LCSW since 07/05/2020 12:00 AM    Problem: Depression     Goal: Decease and manage depressive symptoms/ Connect for counseling   Start Date: 07/05/2020  Expected End Date: 08/25/2020  This Visit's Progress: On track  Priority: High  Note:   Depression screen [facility] 2/9 06/28/2020 05/28/2017 01/19/2017  Decreased Interest 1 0 0  Down, Depressed, Hopeless 1 0 0  PHQ - 2 Score 2 0 0  Altered sleeping 1 - -  Tired, decreased energy 1 - -  Change in appetite 0 - -  Feeling bad or failure about yourself  3 - -  Trouble concentrating 3 - -  Moving slowly or fidgety/restless 0 - -  PHQ-9 Score 10 - -  Difficult doing work/chores Extremely dIfficult - -   Current barriers:    . Acute Mental Health needs related to depression   Currently unable to independently self manage needs related to mental health conditions.  Knowledge Deficits related to short term plan for care coordination  needs and long term plans for chronic disease management . Lacks knowledge of where and how to connect for counseling,  . Needs Support, Education, and Care Coordination in order to meet unmet need.  Clinical Goal(s): Over the next 30 days, patient will work with SW to reduce or manage symptoms of depression, insomnia, and stress until connected for ongoing counseling.  Clinical Interventions:  . Assessed patient's previous treatment, needs, coping skills, current treatment, support system and barriers to care . Patient interviewed and appropriate assessments performed or reviewed: brief mental health assessment;Suicidal Ideation/Homicidal Ideation: No . Provided basic mental health support, education and interventions ( EMMI education :insomnia and getting a good night's sleep; education on depression) . Discussed several options for long term counseling based on need and insurance. Assisted patient with narrowing the  options down to (3 providers that are in network with her insurance) . Reviewed mental health medications with patient prescribed by PCP and discussed compliance  . Other interventions include: Solution-Focused Strategies, Behavioral Activation, Emotional/Supportive Counseling, Sleep Hygiene, and Psychoeducation and/or Health Education  . Collaboration with PCP regarding development and update of comprehensive plan of care as evidenced by provider attestation and co-signature . Inter-disciplinary care team collaboration (see longitudinal plan of care) Patient Goals/Self-Care Activities: Over the next 30 days . Implement interventions discussed today to decreases symptoms of depression, insomnia, and stress and increase knowledge and/or ability of: coping skills, healthy habits, and stress reduction. . Call the therapist we discussed today to schedule an appointment  . Review EMMI educational information (insomnia and getting a good night's sleep and depression) . Review sleep hygiene e-mailed and practice relaxed breathing 3 times daily  Follow Up Plan: 07/13/20      Sammuel Hines, LCSW Care Management & Coordination  [facility]   [phone] 12:36 PM

## 2020-07-06 ENCOUNTER — Encounter: Payer: Self-pay | Admitting: Family Medicine

## 2020-07-13 ENCOUNTER — Ambulatory Visit: Payer: 59 | Admitting: Licensed Clinical Social Worker

## 2020-07-13 DIAGNOSIS — Z7189 Other specified counseling: Secondary | ICD-10-CM

## 2020-07-13 NOTE — Chronic Care Management (AMB) (Signed)
Care Management Clinical Social Work Note  07/13/2020 Name: Paula Drake MRN: [id number] DOB: 03-13-1991  Paula Drake is a 30 y.o. year old female who is a primary care patient of McDiarmid, Leighton Roach, MD.  The Care Management team was consulted for assistance with chronic disease management and coordination needs.  Engaged with patient by telephone for follow up visit in response to provider referral for social work chronic care management and care coordination services  Consent to Services: Patient agreed to services and consent obtained.   Assessment: Patient continues to experience difficulty with managing symptoms of depression.  She is making progress with connecting to a therapist and has her initial assessment scheduled Monday March 28th at 11:00. . See Care Plan below for interventions and patient self-care actives.  Recommendation: Patient may benefit from, and is in agreement to continue implementing interventions discussed until connected for ongoing therapy..   Follow up Plan: Patient would like continued follow-up.  CCM LCSW will f/u with pateint 07/24/20. Patient will call office if needed prior to next encounter  Review of patient past medical history, allergies, medications, and health status, including review of relevant consultants reports was performed today as part of a comprehensive evaluation and provision of chronic care management and care coordination services.  SDOH (Social Determinants of Health) assessments and interventions performed:    Advanced Directives Status: Not addressed in this encounter.  Care Plan  No Known Allergies  Outpatient Encounter Medications as of 07/13/2020  Medication Sig  . benzonatate (TESSALON) 200 MG capsule Take 1 capsule (200 mg total) by mouth 2 (two) times daily as needed for cough.  . escitalopram (LEXAPRO) 20 MG tablet Take 1 tablet (20 mg total) by mouth daily.  Marland Kitchen guaiFENesin-codeine 100-10 MG/5ML syrup Take 10 mLs by mouth at  bedtime as needed for cough.  Marland Kitchen ibuprofen (ADVIL,MOTRIN) 200 MG tablet Take 400 mg by mouth every 6 (six) hours as needed for headache.  . medroxyPROGESTERone (DEPO-PROVERA) 150 MG/ML injection Inject 150 mg into the muscle every 3 (three) months.  . naproxen (NAPROSYN) 500 MG tablet Take 1 tablet (500 mg total) by mouth 2 (two) times daily.  . traZODone (DESYREL) 50 MG tablet Take 0.5-1 tablets (25-50 mg total) by mouth at bedtime as needed for sleep.   No facility-administered encounter medications on file as of 07/13/2020.    Patient Active Problem List   Diagnosis Date Noted  . Depression, major, single episode, moderate (HCC) 06/29/2020  . Adjustment insomnia 06/29/2020  . Vaginal bleeding 06/29/2020  . Contraception 05/17/2013  . OBESITY, MORBID 11/04/2007  . ELEVATED BLOOD PRESSURE 11/04/2007    Conditions to be addressed/monitored: Depression;   Care Plan : Clinical Social Work  Updates made by Soundra Pilon, LCSW since 07/13/2020 12:00 AM  Problem: Depression   Goal: Decease and manage depressive symptoms/ Connect for counseling   Start Date: 07/05/2020  Expected End Date: 08/25/2020  This Visit's Progress: On track  Recent Progress: On track  Priority: High   Current barriers:    . Acute Mental Health needs related to depression   Currently unable to independently self manage needs related to mental health conditions.  Knowledge Deficits related to short term plan for care coordination  needs and long term plans for chronic disease management . Lacks knowledge of where and how to connect for counseling,  . Needs Support, Education, and Care Coordination in order to meet unmet need.  Clinical Goal(s): Over the next 30 days, patient will  work with SW to reduce or manage symptoms of depression, insomnia, and stress until connected for ongoing counseling.  Clinical Interventions:  . Assessed patient's, needs, coping skills, support system and barriers to care . Patient  interviewed and appropriate assessments performed or reviewed: brief mental health assessment;Suicidal Ideation/Homicidal Ideation: No . Provided basic mental health support, education and interventions ( EMMI education :insomnia and getting a good night's sleep; education on depression) . Discussed progress with connection to counseling  . Reviewed mental health medications prescribed by PCP / patient is taking medication and has increased the dose based on PCP's recommendation  . Other interventions include: Solution-Focused Strategies, Behavioral Activation, Emotional/Supportive Counseling, Sleep Hygiene, and Psychoeducation and/or Health Education  . Collaboration with PCP regarding development and update of comprehensive plan of care as evidenced by provider attestation and co-signature . Inter-disciplinary care team collaboration (see longitudinal plan of care) Patient Goals/Self-Care Activities: Over the next 30 days . Implement interventions discussed today to decreases symptoms of depression, insomnia, and stress and increase knowledge and/or ability of: coping skills, healthy habits, and stress reduction. Marland Kitchen Keep appointment with  [facility] Monday March 28th at 11:00 . Review EMMI educational information (insomnia and getting a good night's sleep and depression) . Review sleep hygiene e-mailed and practice relaxed breathing 3 times daily  Follow Up Plan: 07/24/20       Sammuel Hines, LCSW Care Management & Coordination  [facility] / [facility]   [phone] 9:38 AM

## 2020-07-13 NOTE — Patient Instructions (Signed)
  Paula Drake  it was nice speaking with you. Please call me directly [phone] if you have questions about the goals we discussed. Goals Addressed            This Visit's Progress   . Begin and Stick with Counseling-Depression   On track    Timeframe:  Short-Term Goal Priority:  High Start Date:  07/05/20                           Expected End Date:  04/31/22                     Patient Goals/Self-Care Activities: Over the next 30 days . Implement interventions discussed today to decreases symptoms of depression, insomnia, and stress and increase knowledge and/or ability of: coping skills, healthy habits, and stress reduction. Marland Kitchen Keep appointment with  [facility] Monday March 28th at 11:00 . Review EMMI educational information (insomnia and getting a good night's sleep and depression) . Review sleep hygiene e-mailed and practice relaxed breathing 3 times daily   Why is this important?    Beating depression may take some time.   If you don't feel better right away, don't give up on your treatment plan.      Paula Drake received Care Coordination services today:  1. Care Coordination services include personalized support from designated clinical staff supervised by her physician, including individualized plan of care and coordination with other care providers 2. 24/7 contact [phone] for assistance for urgent and routine care needs. 3. Care Coordination are voluntary services and be declined at any time by calling the office.  Patient verbalizes understanding of instructions provided today.    Follow up plan: Appointment scheduled for SW follow up with client by phone on: 07/24/20  Soundra Pilon, LCSW

## 2020-07-24 ENCOUNTER — Ambulatory Visit: Payer: 59 | Admitting: Licensed Clinical Social Worker

## 2020-07-24 DIAGNOSIS — Z7189 Other specified counseling: Secondary | ICD-10-CM

## 2020-07-24 NOTE — Chronic Care Management (AMB) (Signed)
Care Management Clinical Social Work Note  07/24/2020 Name: Paula Drake MRN: [id number] DOB: Sep 13, 1990  Paula Drake is a 30 y.o. year old female who is a primary care patient of McDiarmid, Leighton Roach, MD.  The Care Management team was consulted for assistance with chronic disease management and coordination needs.  Engaged with patient by telephone for follow up visit in response to provider referral for social work care coordination to connect with counseling services.   Consent to Services: Patient agreed to services and consent obtained.   Assessment: Patient continues to experience difficulty with managing symptoms of depression. Reports doing much better with sleep. She is making progress with connecting with counselor for ongoing therapy. (See Care Plan below for interventions and patient self-care actives).  Follow up Plan: Patient would like continued follow-up.  CCM LCSW will f/u with patient in 1 week.. Patient will call office if needed prior to next encounter    Review of patient past medical history, allergies, medications, and health status, including review of relevant consultants reports was performed today as part of a comprehensive evaluation and provision of chronic care management and care coordination services.  SDOH (Social Determinants of Health) assessments and interventions performed:    Advanced Directives Status: Not addressed in this encounter.  Care Plan  No Known Allergies  Outpatient Encounter Medications as of 07/24/2020  Medication Sig  . benzonatate (TESSALON) 200 MG capsule Take 1 capsule (200 mg total) by mouth 2 (two) times daily as needed for cough.  . escitalopram (LEXAPRO) 20 MG tablet Take 1 tablet (20 mg total) by mouth daily.  Marland Kitchen guaiFENesin-codeine 100-10 MG/5ML syrup Take 10 mLs by mouth at bedtime as needed for cough.  Marland Kitchen ibuprofen (ADVIL,MOTRIN) 200 MG tablet Take 400 mg by mouth every 6 (six) hours as needed for headache.  .  medroxyPROGESTERone (DEPO-PROVERA) 150 MG/ML injection Inject 150 mg into the muscle every 3 (three) months.  . naproxen (NAPROSYN) 500 MG tablet Take 1 tablet (500 mg total) by mouth 2 (two) times daily.  . traZODone (DESYREL) 50 MG tablet Take 0.5-1 tablets (25-50 mg total) by mouth at bedtime as needed for sleep.   No facility-administered encounter medications on file as of 07/24/2020.    Patient Active Problem List   Diagnosis Date Noted  . Depression, major, single episode, moderate (HCC) 06/29/2020  . Adjustment insomnia 06/29/2020  . Vaginal bleeding 06/29/2020  . Contraception 05/17/2013  . OBESITY, MORBID 11/04/2007  . ELEVATED BLOOD PRESSURE 11/04/2007    Conditions to be addressed/monitored: Depression;   Care Plan : Clinical Social Work  Updates made by Soundra Pilon, LCSW since 07/24/2020 12:00 AM  Problem: Depression   Goal: Decease and manage depressive symptoms/ Connect for counseling   Start Date: 07/05/2020  Expected End Date: 08/25/2020  Recent Progress: On track  Priority: High   Current barriers:    . Acute Mental Health needs related to depression   Currently unable to independently self manage needs related to mental health conditions.  Knowledge Deficits related to short term plan for care coordination  needs and long term plans for chronic disease management . Lacks knowledge of where and how to connect for counseling,  . Needs Support, Education, and Care Coordination in order to meet unmet need.  Clinical Goal(s): Over the next 30 days, patient will work with SW to reduce or manage symptoms of depression, insomnia, and stress until connected for ongoing counseling.  Clinical Interventions:  . Assessed patient's, needs, coping  skills, support system and barriers to care . Discussed progress with connecting to counseling/ patient completed assessment with [facility] and scheduled to meet with therapist this week March 31st.  Reminded patient of  appointment with PCP 07/26/20 . Reviewed mental health medications prescribed by PCP / patient is taking medication and has increased the dose based PCP's recommendation  . Other interventions include: Solution-Focused Strategies, Behavioral Activation, Emotional/Supportive Counseling, Sleep Hygiene, and Psychoeducation and/or Health Education  . Collaboration with PCP regarding development and update of comprehensive plan of care as evidenced by provider attestation and co-signature . Inter-disciplinary care team collaboration (see longitudinal plan of care) Patient Goals/Self-Care Activities: Over the next 30 days . Implement interventions discussed today to decreases symptoms of depression, insomnia, and stress and increase knowledge and/or ability of: coping skills, healthy habits, and stress reduction. Marland Kitchen Keep appointment with  [facility] 07/26/20 . practice relaxed breathing 3 times daily      Paula Hines, LCSW Care Management & Coordination  [facility] / [facility]   [phone] 11:34 AM

## 2020-07-24 NOTE — Patient Instructions (Signed)
  Ms. Gilbertson  it was nice speaking with you. Please call me directly if you have questions about the goals we discussed. Goals Addressed            This Visit's Progress   . Begin and Stick with Counseling-Depression       Timeframe:  Short-Term Goal Priority:  High Start Date:  07/05/20                           Expected End Date:  04/31/22                     Patient Goals/Self-Care Activities: Over the next 30 days . Implement interventions discussed today to decreases symptoms of depression,  and stress and increase knowledge and/or ability of: coping skills, healthy habits, and stress reduction. Marland Kitchen Keep appointment with  [facility] 07/26/20 . Review EMMI educational information (relaxed breathing 3 times daily   Why is this important?    Beating depression may take some time.   If you don't feel better right away, don't give up on your treatment plan.        Ms. Beazer received Care Coordination services today:  1. Care Coordination services include personalized support from designated clinical staff supervised by her physician, including individualized plan of care and coordination with other care providers 2. 24/7 contact [phone] for assistance for urgent and routine care needs. 3. Care Coordination are voluntary services and be declined at any time by calling the office.  Patient verbalizes understanding of instructions provided today.    Follow up plan: SW will follow up with patient by phone over the next week  Soundra Pilon, LCSW  [phone]

## 2020-07-26 ENCOUNTER — Other Ambulatory Visit: Payer: Self-pay

## 2020-07-26 ENCOUNTER — Ambulatory Visit (INDEPENDENT_AMBULATORY_CARE_PROVIDER_SITE_OTHER): Payer: 59 | Admitting: Family Medicine

## 2020-07-26 ENCOUNTER — Other Ambulatory Visit (HOSPITAL_COMMUNITY)
Admission: RE | Admit: 2020-07-26 | Discharge: 2020-07-26 | Disposition: A | Discharge status: Home / Self Care | Payer: 59 | Source: Ambulatory Visit | Attending: Family Medicine | Admitting: Family Medicine

## 2020-07-26 ENCOUNTER — Encounter: Payer: Self-pay | Admitting: Family Medicine

## 2020-07-26 VITALS — BP 128/74 | HR 80 | Ht 62.0 in | Wt 334.0 lb

## 2020-07-26 DIAGNOSIS — Z124 Encounter for screening for malignant neoplasm of cervix: Secondary | ICD-10-CM

## 2020-07-26 DIAGNOSIS — F5102 Adjustment insomnia: Secondary | ICD-10-CM | POA: Diagnosis not present

## 2020-07-26 DIAGNOSIS — N939 Abnormal uterine and vaginal bleeding, unspecified: Secondary | ICD-10-CM

## 2020-07-26 DIAGNOSIS — A749 Chlamydial infection, unspecified: Secondary | ICD-10-CM

## 2020-07-26 DIAGNOSIS — F321 Major depressive disorder, single episode, moderate: Secondary | ICD-10-CM

## 2020-07-26 DIAGNOSIS — L089 Local infection of the skin and subcutaneous tissue, unspecified: Secondary | ICD-10-CM | POA: Diagnosis not present

## 2020-07-26 DIAGNOSIS — R229 Localized swelling, mass and lump, unspecified: Secondary | ICD-10-CM

## 2020-07-26 HISTORY — DX: Chlamydial infection, unspecified: A74.9

## 2020-07-26 MED ORDER — CEPHALEXIN 500 MG PO CAPS: 500.0000 mg | ORAL_CAPSULE | Freq: Four times a day (QID) | ORAL | 0 refills | Status: DC

## 2020-07-26 NOTE — Patient Instructions (Signed)
Thank you for following up about your abnormal bleeding We are checking blood work to see if this bleeding is from hormones. We are checking an ultrasound of your uterus (womb) to see if there is a change in the uterus that could cause bleeding.  We performed a Pap Smear to look for cervical cancer.   Please start the antibiotic cephalexin to treat the boil under the skin at your lower abdomen. Please hold a heating pad or a warm washcloth again the boil four times a day for 15 minutes at a time.   The firm area in your skin will take about 6 to 8 weeks to go away after the antibiotics.

## 2020-07-26 NOTE — Progress Notes (Signed)
Paula Drake is alone Sources of clinical information for visit is/are patient and past medical records. Nursing assessment for this office visit was reviewed with the patient for accuracy and revision.     Previous Report(s) Reviewed: ER records, lab reports, office notes   Depression screen [facility] 2/9 07/26/2020  Decreased Interest 1  Down, Depressed, Hopeless 1  PHQ - 2 Score 2  Altered sleeping 2  Tired, decreased energy 1  Change in appetite 1  Feeling bad or failure about yourself  3  Trouble concentrating 3  Moving slowly or fidgety/restless 0  Suicidal thoughts 1  PHQ-9 Score 13  Difficult doing work/chores -    Fall Risk  01/19/2017 10/30/2016 05/16/2016 09/11/2015 10/11/2014  Falls in the past year? No No No No No    PHQ9 SCORE ONLY 07/26/2020 06/28/2020 05/28/2017  PHQ-9 Total Score 13 10 0    Adult vaccines due  Topic Date Due  . TETANUS/TDAP  11/03/2017    Health Maintenance Due  Topic Date Due  . Hepatitis C Screening  Never done  . COVID-19 Vaccine (1) Never done  . PAP-Cervical Cytology Screening  Never done  . PAP SMEAR-Modifier  06/02/2015  . TETANUS/TDAP  11/03/2017      History/P.E. limitations: none  Adult vaccines due  Topic Date Due  . TETANUS/TDAP  11/03/2017   There are no preventive care reminders to display for this patient.  Health Maintenance Due  Topic Date Due  . Hepatitis C Screening  Never done  . COVID-19 Vaccine (1) Never done  . PAP-Cervical Cytology Screening  Never done  . PAP SMEAR-Modifier  06/02/2015  . TETANUS/TDAP  11/03/2017     Chief Complaint  Patient presents with  . Medication Management  . Menstrual Problem  . Depression     Abnormal Uterine Bleeding  LMP: 2-3 three weeks ago Usual Menses characteristics:     Frequency: 3 days to a 3 months      Regularity: not regular      Bleed amount:  How often do you usually change your sanitary pad/tampon: once every two hour - not soaked              How often do  you usually change your sanitary pad/tampon during peak flow days: same   How many pads/tampons do you use over a single  menstrual period: most times a pack and half              Has a medical provider told you that you are anemic:  no   How large are any clots that are passed: yes       Duration of periods, in general: longest was 3 months                   - If bleeding is irregular, about how many bleeding episodes have there been in the past 6 to 12 months: unable to tell when stop and start  Change in Usual Menses characteritics        Onset of change: always irreglar since start of having periods        Amount of change in menstrual bleeding:                  Soaking a pad or tampon more than every two hours? yes                  Wakes patient from sleep, stains clothing or sheets? no  Pattern of change in menstrual bleeds:                           Cyclic Bleed (shorter/longer period between menses, shorter/longer menstrual bleeds: no                            Intermenstrual Bleed/spotting: yes                                     Amount of normal Menstrual Bleed: uncertain               Course (stable, worsening, improving): stable         Function impairment from change in menstrual bleeding (work, school, social,recreation): you never know when it will start, can't where clothers she wants  Self-treatments : none Associated Symptoms: Lower abdominal pain: no Fever: no Vaginal discharge: no Dysmenorrhea: no  Dyspareunia: no   Infertility: no Changes in bladder or bowel function: no Galactorrhea: no,  Hirsutism: upper lip treated with waxing Hot flashes: no   Boils in perium and groin present - Shaves hair of lower abdomen, mons, and perineum  Trauma (Acute or Chronic): no Prior Diagnostic Testing or Treatments: Depoprovera - last dose in 2020 - was successful in achieving amenorrheic state.   Relevant PMH/PSH: Prior Pelvic Surgery: no Prior Pelvic  Irradiation: no   Thyroid Disease: no  Coagulopathy: no        Family History coagulopathy: no Medications: recent start of Lexapro and trazodone Endometrial Cancer Risk Factors:   PCOS diagnosis: no  Menarche age: 30 yrs   Parity: Zero   DIABETES: no       Unopposed Estrogen Therapy: no    Family History Colon, Breast, ovarian, uterine Cancers: Father lung (smoker). pateranl Aunts breast cancer Gravid 0 Parity 0 Sexual history -  Sexual orientation: men Number sexual partners in last 6 months: none  History of sexually transmitted infections: no Contraceptive history:     Current: no     Past: OCP but changed to Depoprovera. Depoprovera last in 2020,      ROS: Lactation: no  Stress: yes, fiances, social  Eating D/O: no  Intense Exercise: no  Exam:   Visual Fields: full  Thyroid:   Abdomen: [place] firmness on mons- non-fluctuant,  mild tender, no punctum, no drainage, no overlying erythema. ~4-5 cm x 2-3 cm.  Hairs on mons shaved  Pelvis: Normal external female genitalia. Normal appearing non-parous cervix.limited bimanual exam but no overt abnormality in uterine size or contour.  No palp adnexa masses   Pap: obtained - sent also for GC/Chlamydia   Hiruitism: Facial: depilitated upper lip    Back: no hair Abdomen/thigh: not in female pattern.    Acne:  no   Labs   Upreg: neg      LFTs: WNL    CBC: WNL    TSH:  Lab Results  Component Value Date   TSH 0.842 07/26/2020       GC/Chlamydia: P     Others:  Prolactin: P  Testosterone: P              Imaging:       Pelvic Ultrasound:  P  PALM-COIEN differential Pregnancy Adenomyosis Leiyomyoma Malignancy-Hypertrophy  Coagulopathy Ovarian Dysfunction Iatrogenic Endometriosis Not yet classified  Intermenstrual Bleeding (Spotting) Ovulatory dysfuntion  Endometrial polyps.  Unscheduled bleeding due to a contraceptive method Endometrial hyperplasia or carcinoma or, rarely, uterine sarcoma.  Endometritis or PID   Endometrial abnormalities related to previous endometrial trauma (eg, a hysterotomy scar or "niche" following cesarean delivery) cervical cancer cervical polyps,  Cervicitis Ectropion  Irregular bleeding (absence of ovulatory cycles; phases of no bleeding that may last for two or more months and other phases with either cycles of just spotting or cycles of episodes of heavy menstrual bleeding. [place] are typically absent)  Ovulatory dysfunction   - early menarche or climacteric  - polycystic ovarian syndrome  - Thyroid disorder   -  Hyperprolactinemia  - Chronic liver or renal disease  -  Estrogen-progestin contraceptives and Progestins    -      - Antipsychotic drugs    - Corticosteroids    - Chemotherapeutic agents   ENDOMETRIAL SAMPLING INDICATIONS Age 52 years to menopause - In women who are ovulatory, any AUB Younger than 45 years (reproductive-age women)  - if AUB is persistent,  - occurs in the setting of a history of unopposed estrogen exposure (obesity, chronic anovulation)  -  failed medical management of the bleeding - high risk of endometrial cancer        Risk factors for endometrial carcinoma - mnemonic COLD NUT . Cancer Hx (ovarian, breast, colon).  . Obesity . Late menopause.  . Diabetes.  . Nulliparity.  Marland Kitchen Unopposed estrogen (PCOS, anovulation, HRT w/o progesterone)  . Tamoxifen use.  . Used for breast cancer; the risk is quite small or possibly negligent.   IMAGING - Transvaginal examination should be performed, unless there is a reason to not perform the vaginal study (eg, virginal patient).

## 2020-07-27 ENCOUNTER — Encounter: Payer: Self-pay | Admitting: Family Medicine

## 2020-07-27 DIAGNOSIS — R229 Localized swelling, mass and lump, unspecified: Secondary | ICD-10-CM | POA: Insufficient documentation

## 2020-07-27 LAB — FERRITIN: Ferritin: 100 ng/mL (ref 15–150)

## 2020-07-27 LAB — TESTOSTERONE: Testosterone: 55 ng/dL (ref 13–71)

## 2020-07-27 LAB — TSH: TSH: 0.842 u[IU]/mL (ref 0.450–4.500)

## 2020-07-27 LAB — PROLACTIN: Prolactin: 5.7 ng/mL (ref 4.8–23.3)

## 2020-07-27 NOTE — Assessment & Plan Note (Addendum)
Depression screen [facility] 2/9 07/26/2020 06/28/2020 05/28/2017  Decreased Interest 1 1 0  Down, Depressed, Hopeless 1 1 0  PHQ - 2 Score 2 2 0  Altered sleeping 2 1 -  Tired, decreased energy 1 1 -  Change in appetite 1 0 -  Feeling bad or failure about yourself  3 3 -  Trouble concentrating 3 3 -  Moving slowly or fidgety/restless 0 0 -  Suicidal thoughts 1 - -  PHQ-9 Score 13 10 -  Difficult doing work/chores - Extremely dIfficult -   More energy but no improvement in mood.  Thinking is much more effortful than her usual state.  Sleep remains problematic though improved with Trazodone 75 mg at bedtime. She is taking every night. She thinks about death, but has no plans to harm herslef or others.  She says that others depend upon her and could not harm herself.  Feeling discouraged about her ability to cope with financial strain, familial relations and self-esteem impairment. She has an initial appointment with a counselor this afternoon.   Assessment and Plan Major Depression Disorder, single episode, moderate - perhaps stabilized but this episode remains active, early phase of therapy.  - Paula Drake is not ready to return to work, even on a part-time basis for now.  Will reassess in 4 weeks.  Hopefully, we will see a significant response by then.  - RTC 4 weeks, prn if needed

## 2020-07-27 NOTE — Assessment & Plan Note (Signed)
New problem Mons pubis location Working explanation is skin and soft tissue structure infection or granulomatous reaction- possibly related to chronic ingrown hair.  Rx Keflex 50O mg four times a day x 10 days.  Warm compresses. Stop shaving.

## 2020-07-27 NOTE — Assessment & Plan Note (Signed)
Established problem Slight improvement qualitatively per patient, though rated sleep at level 3 on PHQ9. Trazodone at 75 mg at bedtime has been effective to help her fall asleep, though EMA remains a problem. She is taking the trazodone every night though prn rx.  Sleep Hygiene importance reviewed.  Plan to continue trazodone 75 mg at bedtime prn. Counseled about avoiding stopping trazodone abruptly.  Should have titration off to avoid rebound insomnia, when time comes/

## 2020-07-27 NOTE — Assessment & Plan Note (Addendum)
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  Paula Drake worries about possiblity of cancer causing bleeding. This is reinforced by her experience with her father's lung cancer.   Both intermenstrual bleeding and irregular bleeding could apply to her case.   Leading the differential ould be Ovarian Dysfunction.  Would look for evidence of Polycystic Ovarian Syndrome as well. Rule out structural causes.   Plan Ordered Serum prolactin and total testosterone TSH PAP TVUS Ferritin

## 2020-07-30 LAB — CYTOLOGY - PAP
Chlamydia: POSITIVE — AB
Comment: NEGATIVE
Comment: NEGATIVE
Comment: NORMAL
Diagnosis: NEGATIVE
High risk HPV: NEGATIVE
Neisseria Gonorrhea: NEGATIVE

## 2020-07-31 ENCOUNTER — Telehealth: Payer: Self-pay | Admitting: Family Medicine

## 2020-07-31 ENCOUNTER — Other Ambulatory Visit: Payer: Self-pay | Admitting: Family Medicine

## 2020-07-31 ENCOUNTER — Encounter: Payer: Self-pay | Admitting: Family Medicine

## 2020-07-31 DIAGNOSIS — B373 Candidiasis of vulva and vagina: Secondary | ICD-10-CM

## 2020-07-31 DIAGNOSIS — Z7251 High risk heterosexual behavior: Secondary | ICD-10-CM

## 2020-07-31 DIAGNOSIS — B3731 Acute candidiasis of vulva and vagina: Secondary | ICD-10-CM

## 2020-07-31 DIAGNOSIS — F321 Major depressive disorder, single episode, moderate: Secondary | ICD-10-CM

## 2020-07-31 DIAGNOSIS — A749 Chlamydial infection, unspecified: Secondary | ICD-10-CM

## 2020-07-31 MED ORDER — ESCITALOPRAM OXALATE 20 MG PO TABS: 20.0000 mg | ORAL_TABLET | Freq: Every day | ORAL | 2 refills | Status: DC

## 2020-07-31 MED ORDER — DOXYCYCLINE HYCLATE 100 MG PO TABS: 100.0000 mg | ORAL_TABLET | Freq: Two times a day (BID) | ORAL | 0 refills | Status: AC

## 2020-07-31 MED ORDER — FLUCONAZOLE 150 MG PO TABS: 150.0000 mg | ORAL_TABLET | Freq: Once | ORAL | 0 refills | Status: AC

## 2020-07-31 NOTE — Telephone Encounter (Signed)
ERROR

## 2020-07-31 NOTE — Telephone Encounter (Signed)
There was nothing on form for clinic staff to complete.  Form placed in provider's box.  Aurelio Mccamy,CMA

## 2020-07-31 NOTE — Telephone Encounter (Signed)
Disability and Leave Provider Statement form dropped off for at front desk for completion.  Verified that patient section of form has been completed.  Last DOS/WCC with PCP was 07/26/20.  Placed form in team folder to be completed by clinical staff.  Paula Drake

## 2020-07-31 NOTE — Telephone Encounter (Signed)
I sp[oke with Ms Dalto about her Chlamydia and candida infections. We discussed antibiotic treatment, contact treatment, retesting, and prevention.   Her last sexual contact was in 2020.  We discussed ability to inform partner anonymously via online service and advise medical evaluation.   Rx Doxy 100 mg twice a day x 7 days Rx Diflucan 150 mg once  patient requested refill of Lexapro 20 mg tab - refill for 90 days with RF x 2.

## 2020-08-01 ENCOUNTER — Ambulatory Visit: Payer: 59 | Admitting: Family Medicine

## 2020-08-02 ENCOUNTER — Encounter: Payer: Self-pay | Admitting: Family Medicine

## 2020-08-02 NOTE — Progress Notes (Unsigned)
I completed and signed Disability and Leave Provider Statement from [facility], by mouth [id number], [place], [place] [address].  Fax [phone].  Form placed in RN Triage Box.

## 2020-08-02 NOTE — Telephone Encounter (Signed)
Copy of form placed to be scanned and patient notified that she can pick up the original.  Charnee Turnipseed,CMA

## 2020-08-02 NOTE — Telephone Encounter (Signed)
Formed completed and sign.  Put in RN box.

## 2020-08-08 ENCOUNTER — Ambulatory Visit (INDEPENDENT_AMBULATORY_CARE_PROVIDER_SITE_OTHER): Payer: 59 | Admitting: Family Medicine

## 2020-08-08 ENCOUNTER — Other Ambulatory Visit: Payer: Self-pay

## 2020-08-08 ENCOUNTER — Encounter: Payer: Self-pay | Admitting: Family Medicine

## 2020-08-08 ENCOUNTER — Ambulatory Visit
Admission: RE | Admit: 2020-08-08 | Discharge: 2020-08-08 | Disposition: A | Discharge status: Home / Self Care | Payer: 59 | Source: Ambulatory Visit | Attending: Family Medicine | Admitting: Family Medicine

## 2020-08-08 DIAGNOSIS — Z7251 High risk heterosexual behavior: Secondary | ICD-10-CM | POA: Diagnosis not present

## 2020-08-08 DIAGNOSIS — N939 Abnormal uterine and vaginal bleeding, unspecified: Secondary | ICD-10-CM

## 2020-08-08 DIAGNOSIS — R030 Elevated blood-pressure reading, without diagnosis of hypertension: Secondary | ICD-10-CM | POA: Diagnosis not present

## 2020-08-08 DIAGNOSIS — Z8619 Personal history of other infectious and parasitic diseases: Secondary | ICD-10-CM

## 2020-08-08 HISTORY — DX: Personal history of other infectious and parasitic diseases: Z86.19

## 2020-08-08 NOTE — Assessment & Plan Note (Signed)
We discussed the importance of treating chlamydia infections.  She reports that she is not currently sexually active. -Follow-up HIV, RPR

## 2020-08-08 NOTE — Assessment & Plan Note (Signed)
Mildly elevated blood pressure today.  Her previous 2 blood pressure measurements have been appropriate.  She reports that she was feeling particularly anxious today because of her abnormal uterine bleeding. -Follow-up with PCP to consider starting antihypertensives

## 2020-08-08 NOTE — Assessment & Plan Note (Signed)
This seems to have been going on for about 6 years.  Her last CBC shows a hemoglobin of 14.  No need for iron at this time.  She was encouraged to follow-up with her scheduled ultrasound for the next appropriate steps.  She may benefit from hormonal birth control for this issue.  She was reassured that she is unlikely related to cancer based on her age and more likely related to peripheral adiposity. -Follow-up ultrasound -Follow-up with Dr. Perley Jain

## 2020-08-08 NOTE — Progress Notes (Signed)
    SUBJECTIVE:   CHIEF COMPLAINT / HPI:   Abnormal uterine bleeding Paula Drake reports she has been having abnormal uterine bleeding since 2016.  She notes heavy menstrual periods in addition to bleeding between periods.  She was recently seen by Dr. McDiarmid who ordered a pelvic ultrasound for further assessment.  She notes that she is worried about this because her father passed away from cancer.  Hx of Chlamydia infection Found on her last Pap smear.  She was treated with a course of doxycycline.  She has completed her course.  She reports that she has not been sexually active for years.   PERTINENT  PMH / PSH: BMI of 60  OBJECTIVE:   BP (!) 141/94   Pulse 71   Ht 5\' 2"  (1.575 m)   Wt (!) 331 lb 4 oz (150.3 kg)   LMP 07/25/2020 Comment: still bleeding  SpO2 97%   BMI 60.59 kg/m    General: Alert and cooperative and appears to be in no acute distress Cardio: Normal S1 and S2, no S3 or S4. Rhythm is regular. No murmurs or rubs.   Pulm: Clear to auscultation bilaterally, no crackles, wheezing, or diminished breath sounds. Normal respiratory effort Abdomen: Bowel sounds normal. Abdomen soft and non-tender.  Extremities: No peripheral edema. Warm/ well perfused.  Strong radial pulses. Neuro: Cranial nerves grossly intact  ASSESSMENT/PLAN:   Abnormal uterine bleeding This seems to have been going on for about 6 years.  Her last CBC shows a hemoglobin of 14.  No need for iron at this time.  She was encouraged to follow-up with her scheduled ultrasound for the next appropriate steps.  She may benefit from hormonal birth control for this issue.  She was reassured that she is unlikely related to cancer based on her age and more likely related to peripheral adiposity. -Follow-up ultrasound -Follow-up with Dr. McDiarmid  History of chlamydia We discussed the importance of treating chlamydia infections.  She reports that she is not currently sexually active. -Follow-up HIV,  RPR  ELEVATED BLOOD PRESSURE Mildly elevated blood pressure today.  Her previous 2 blood pressure measurements have been appropriate.  She reports that she was feeling particularly anxious today because of her abnormal uterine bleeding. -Follow-up with PCP to consider starting antihypertensives     07/27/2020, MD [facility]

## 2020-08-08 NOTE — Patient Instructions (Signed)
Abnormal uterine bleeding: It sounds like you are getting the appropriate work-up for this already.  I will that she follow-up with Dr. Celso Amy regarding the ultrasound you are getting today.  It may be helpful to start you on birth control in the future to help with the bleeding.  Chlamydia: No further work-up needed at this time.  I will let you know if there are any abnormalities regarding the lab work we get today.

## 2020-08-09 ENCOUNTER — Ambulatory Visit: Payer: 59 | Admitting: Licensed Clinical Social Worker

## 2020-08-09 DIAGNOSIS — Z719 Counseling, unspecified: Secondary | ICD-10-CM

## 2020-08-09 LAB — RPR: RPR Ser Ql: NONREACTIVE

## 2020-08-09 LAB — HIV ANTIBODY (ROUTINE TESTING W REFLEX): HIV Screen 4th Generation wRfx: NONREACTIVE

## 2020-08-09 NOTE — Chronic Care Management (AMB) (Signed)
Chronic Care Management    Clinical Social Work Note  08/09/2020 Name: Paula Drake MRN: [id number] DOB: Jul 16, 1990  Paula Drake is a 30 y.o. year old female who is a primary care patient of McDiarmid, Leighton Roach, MD. The CCM team was consulted to assist the patient with chronic disease management and/or care coordination needs related to: Mental Health Counseling and Resources.   Engaged with patient by telephone for follow up visit in response to provider referral for social work chronic care management and care coordination services.   Consent to Services:  The patient was given information about Chronic Care Management services, agreed to services, and gave verbal consent prior to initiation of services.  Please see initial visit note for detailed documentation.   Patient agreed to services and consent obtained.   Assessment: Patient is making progress with and has started counseling. . See Care Plan below for interventions and patient self-care actives. Recent life changes [name]: Does not believe she is making a good connection with her therapist Recommendation: Patient may benefit from, and is in agreement to keep appointment tomorrow. She will discuss concerns with therapist meeting her needs and f/u with LCSW.   Follow up Plan: Patient would like continued follow-up.  CCM LCSW will wait for call from patient, if no call is received will reach out to patient in 30 days. Review of patient past medical history, allergies, medications, and health status, including review of relevant consultants reports was performed today as part of a comprehensive evaluation and provision of chronic care management and care coordination services.     SDOH (Social Determinants of Health) assessments and interventions performed:    Advanced Directives Status: Not addressed in this encounter.  CCM Care Plan  No Known Allergies  Outpatient Encounter Medications as of 08/09/2020  Medication Sig  .  escitalopram (LEXAPRO) 20 MG tablet Take 1 tablet (20 mg total) by mouth daily.  Marland Kitchen ibuprofen (ADVIL,MOTRIN) 200 MG tablet Take 400 mg by mouth every 6 (six) hours as needed for headache.  . traZODone (DESYREL) 50 MG tablet Take 1.5 tablets (75 mg total) by mouth at bedtime as needed for sleep.   No facility-administered encounter medications on file as of 08/09/2020.    Patient Active Problem List   Diagnosis Date Noted  . History of chlamydia 08/08/2020  . Subcutaneous nodule 07/27/2020  . Depression, major, single episode, moderate (HCC) 06/29/2020  . Adjustment insomnia 06/29/2020  . Abnormal uterine bleeding 06/29/2020  . Contraception 05/17/2013  . OBESITY, MORBID 11/04/2007  . ELEVATED BLOOD PRESSURE 11/04/2007    Conditions to be addressed/monitored: Anxiety and Depression;  Care Plan : Clinical Social Work  Updates made by Soundra Pilon, LCSW since 08/09/2020 12:00 AM  Problem: Depression     Goal: Decease and manage depressive symptoms/ Connect for counseling   Start Date: 07/05/2020  Expected End Date: 08/25/2020  Recent Progress: On track  Priority: High   Current barriers:    . Acute Mental Health needs related to depression   Currently unable to independently self manage needs related to mental health conditions.  Knowledge Deficits related to short term plan for care coordination  needs and long term plans for chronic disease management . Lacks knowledge of where and how to connect for counseling,  . Needs Support, Education, and Care Coordination in order to meet unmet need.  Clinical Goal(s): Over the next 30 days, patient will reduce or manage symptoms of depression, insomnia, and stress via ongoing counseling.  Clinical Interventions:  . Assessed patient's, needs, coping skills, support system and barriers to care . Discussed progress with connecting to counseling/ patient completed assessment with [facility] has meet with therapist but unsure of  connection. Has next appointment 08/10/20 and will contact LCSW with an update. . Other interventions include: Solution-Focused Strategies, Emotional Support  . Inter-disciplinary care team collaboration (see longitudinal plan of care) Patient Goals/Self-Care Activities: Over the next 30 days . Keep appointment with  [facility] 08/09/20 . practice relaxed breathing 3 times daily      Sammuel Hines, LCSW Care Management & Coordination  [facility] / [facility]   [phone] 10:44 AM

## 2020-08-09 NOTE — Patient Instructions (Addendum)
  Ms. Manzer  it was nice speaking with you. Please call me directly if you have questions about the goals we discussed. Goals Addressed            This Visit's Progress   . Begin and Stick with Counseling-Depression   On track    Timeframe:  Short-Term Goal Priority:  High Start Date:  07/05/20                           Expected End Date:  04/31/22                     Patient Goals/Self-Care Activities:  . Keep appointment with  [facility] 08/10/20 . Review EMMI educational information (relaxed breathing 3 times daily  . Call me to provide an update  Why is this important?    Beating depression may take some time.   If you don't feel better right away, don't give up on your treatment plan.       Ms. Johndrow received Care Coordination services today:  1. Care Coordination services include personalized support from designated clinical staff supervised by her physician, including individualized plan of care and coordination with other care providers 2. 24/7 contact [phone] for assistance for urgent and routine care needs. 3. Care Coordination are voluntary services and be declined at any time by calling the office.  Patient verbalizes understanding of instructions provided today.    Follow up plan: SW will follow up with patient by phone over the next 30 days  Soundra Pilon, LCSW  [phone]

## 2020-08-13 NOTE — Progress Notes (Unsigned)
Has the document been placed in the pile to be scanned?

## 2020-08-31 ENCOUNTER — Ambulatory Visit: Payer: 59 | Admitting: Licensed Clinical Social Worker

## 2020-08-31 NOTE — Patient Instructions (Signed)
Visit Information  Goals Addressed            This Visit's Progress   . Begin and Stick with Counseling-Depression   On track    Timeframe:  Short-Term Goal Priority:  High Start Date:  07/05/20                           Expected End Date:                   Patient Goals/Self-Care Activities:  . Keep appointment with  [facility]  . Talk to your therapist and share your feeling and concerns  . Send message to Dr. McDiarmid about your concerns with the medication  .  Why is this important?    Beating depression may take some time.   If you don't feel better right away, don't give up on your treatment plan.      Patient verbalizes understanding of instructions provided today and agrees to view in MyChart.   The care management team will reach out to the patient again over the next 7 to 14  days.   Sammuel Hines, LCSW Care Management & Coordination  [phone]

## 2020-08-31 NOTE — Chronic Care Management (AMB) (Signed)
Care Management   Clinical Social Work Note  08/31/2020 Name: Paula Drake MRN: [id number] DOB: [birth year]  Paula Drake is a 30 y.o. year old female who is a primary care patient of McDiarmid, Leighton Roach, MD. The CCM team was consulted to assist the patient with chronic disease management and/or care coordination needs related to: Mental Health Counseling and Resources.   Engaged with patient by telephone for follow up visit in response to provider referral for social work chronic care management and care coordination services.   Consent to Services:  The patient was given information about Chronic Care Management services, agreed to services, and gave verbal consent prior to initiation of services.  Please see initial visit note for detailed documentation.   Patient agreed to services and consent obtained.   Assessment: Patient is making progress with and has connected with therapist , but continues to have difficulty with establishing a theraputic relationship.  . See Care Plan below for interventions and patient self-care actives. Recent life changes Paula Drake: Does not believe the medication is working for her. Recommendation: Patient may benefit from, and is in agreement to reach out to PCP to discuss concerns with Medication and concerns with therapy. LCSW will also share concerns with PCP. Follow up Plan: Patient would like continued follow-up.  CCM LCSW will follow up with patient in 1 to 2 weeks.. Patient will call office if needed prior to next encounter.   Review of patient past medical history, allergies, medications, and health status, including review of relevant consultants reports was performed today as part of a comprehensive evaluation and provision of chronic care management and care coordination services.     SDOH (Social Determinants of Health) assessments and interventions performed:    Advanced Directives Status: Not addressed in this encounter.  CCM Care Plan  No  Known Allergies  Outpatient Encounter Medications as of 08/31/2020  Medication Sig  . escitalopram (LEXAPRO) 20 MG tablet Take 1 tablet (20 mg total) by mouth daily.  Marland Kitchen ibuprofen (ADVIL,MOTRIN) 200 MG tablet Take 400 mg by mouth every 6 (six) hours as needed for headache.  . traZODone (DESYREL) 50 MG tablet Take 1.5 tablets (75 mg total) by mouth at bedtime as needed for sleep.   No facility-administered encounter medications on file as of 08/31/2020.    Patient Active Problem List   Diagnosis Date Noted  . History of chlamydia 08/08/2020  . Subcutaneous nodule 07/27/2020  . Depression, major, single episode, moderate (HCC) 06/29/2020  . Adjustment insomnia 06/29/2020  . Abnormal uterine bleeding 06/29/2020  . Contraception 05/17/2013  . OBESITY, MORBID 11/04/2007  . ELEVATED BLOOD PRESSURE 11/04/2007    Conditions to be addressed/monitored: Anxiety and Depression;  Care Plan : Clinical Social Work  Updates made by Paula Pilon, LCSW since 08/31/2020 12:00 AM  Problem: Depression   Goal: Decrease and manage depressive symptoms/ Connect for counseling   Start Date: 07/05/2020  Recent Progress: On track  Priority: High   Current barriers:    . Acute Mental Health needs related to depression   Currently unable to independently self manage needs related to mental health conditions. . Needs Support, Education, and Care Coordination in order to meet unmet need.  Clinical Goal(s) patient will continue to reduce and manage symptoms of depression, insomnia, and stress via ongoing counseling.  Clinical Interventions:  . Assessed patient's, needs, progress and barriers to care with new counselor . Patient completed assessment with [facility] has meet with therapist but unsure  of connection. Has had several appointment but still has not established a therapeutic connection.   . Other interventions include: Solution-Focused Strategies, Emotional Support Active listening / Reflection  utilized , Problem Solving /Task Center , and Verbalization of feelings encouraged Reviewed mental health medications with patient and discussed compliance: Patient reports that she is taking her medication but does not think it is working. Paula Drake care team collaboration (see longitudinal plan of care) Patient Goals/Self-Care Activities: Over the next 30 days . Keep appointment with  [facility]  . Talk to your therapist and share your feeling and concerns  . Send message to Dr. McDiarmid about your concerns with the medication      Paula Hines, LCSW Care Management & Coordination  [facility] / [facility]   [phone] 9:27 AM

## 2020-09-04 ENCOUNTER — Encounter: Payer: Self-pay | Admitting: Family Medicine

## 2020-09-11 ENCOUNTER — Encounter: Payer: Self-pay | Admitting: Family Medicine

## 2020-09-17 ENCOUNTER — Ambulatory Visit: Payer: 59 | Admitting: Licensed Clinical Social Worker

## 2020-09-17 ENCOUNTER — Telehealth: Payer: Self-pay | Admitting: Family Medicine

## 2020-09-17 DIAGNOSIS — Z7189 Other specified counseling: Secondary | ICD-10-CM

## 2020-09-17 DIAGNOSIS — Z719 Counseling, unspecified: Secondary | ICD-10-CM

## 2020-09-17 NOTE — Chronic Care Management (AMB) (Signed)
Care Management   Clinical Social Work Note  09/17/2020 Name: Paula Drake MRN: [id number] DOB: 31-Jul-1990  Paula Drake is a 30 y.o. year old female who is a primary care patient of McDiarmid, Leighton Roach, MD. The CCM team was consulted to assist the patient with chronic disease management and/or care coordination needs related to: Mental Health Counseling and Resources.   Engaged with patient by telephone for follow up visit in response to provider referral for social work chronic care management and care coordination services.   Consent to Services:  The patient was given information about Chronic Care Management services, agreed to services, and gave verbal consent prior to initiation of services.  Please see initial visit note for detailed documentation.   Patient agreed to services and consent obtained.   Assessment: Patient continues to experience difficulty with symptoms of depression and does not believe the medication is working... See Care Plan below for interventions and patient self-care actives. Recent life changes Paula Drake: She has connected with a therapist however has not established a therapeutic connection.  Recommendation: Patient may benefit from, and is in agreement to talk to PCP about medication concerns..  Follow up Plan: No follow up scheduled with CCM team at this time.  Patient states she will call LCSW. Will follow up with patient in 2 to 3 weeks if no call is received.   : Review of patient past medical history, allergies, medications, and health status, including review of relevant consultants reports was performed today as part of a comprehensive evaluation and provision of chronic care management and care coordination services.     SDOH (Social Determinants of Health) assessments and interventions performed:    Advanced Directives Status: Not addressed in this encounter.  CCM Care Plan  No Known Allergies  Outpatient Encounter Medications as of 09/17/2020   Medication Sig  . escitalopram (LEXAPRO) 20 MG tablet Take 1 tablet (20 mg total) by mouth daily.  Marland Kitchen ibuprofen (ADVIL,MOTRIN) 200 MG tablet Take 400 mg by mouth every 6 (six) hours as needed for headache.  . traZODone (DESYREL) 50 MG tablet Take 1.5 tablets (75 mg total) by mouth at bedtime as needed for sleep.   No facility-administered encounter medications on file as of 09/17/2020.    Patient Active Problem List   Diagnosis Date Noted  . Subcutaneous nodule 07/27/2020  . Depression, major, single episode, moderate (HCC) 06/29/2020  . Adjustment insomnia 06/29/2020  . Abnormal uterine bleeding 06/29/2020  . Contraception 05/17/2013  . OBESITY, MORBID 11/04/2007  . ELEVATED BLOOD PRESSURE 11/04/2007    Conditions to be addressed/monitored: Depression; Mental Health Concerns   Care Plan : Clinical Social Work  Updates made by Soundra Pilon, LCSW since 09/17/2020 12:00 AM  Problem: Depression   Goal: Decrease and manage depressive symptoms   Start Date: 07/05/2020  This Visit's Progress: On track  Recent Progress: On track  Priority: High   Current barriers:    . Acute Mental Health needs related to depression   Currently unable to independently self manage needs related to mental health conditions. . Needs Support, and Care Coordination in order to meet unmet need.  Clinical Goal(s) patient will continue to reduce and manage symptoms of depression, insomnia, and stress via ongoing counseling.  Clinical Interventions:  . Assessed patient's needs, progress and barriers to care with new counselor and current medication  . Patient has meet with therapist [facility] but still has not established a therapeutic connection.   . Other  interventions include: Solution-Focused Strategies, Emotional Support ,Active listening , Problem Solving /Task Center and Verbalization of feelings encouraged Reviewed mental health medications with patient and discussed compliance:  . Patient  reports that she is taking her medication but does not think it is working. . Assisted patient with making appointment with PCP . Inter-disciplinary care team collaboration (see longitudinal plan of care) Patient Goals/Self-Care Activities: Over the next 30 days . Keep appointments with  [facility]  . Talk to your therapist and share your feelings and concerns  . Keep appointment with Dr. Perley Jain 09/27/20 to discuss concerns with the medication       Sammuel Hines, LCSW Care Management & Coordination  [facility] / [facility]   [phone] 10:17 AM

## 2020-09-17 NOTE — Patient Instructions (Signed)
Visit Information  Goals Addressed            This Visit's Progress   . Stick with Counseling       Timeframe:  Short-Term Goal Priority:  High Start Date:  07/05/20                           Expected End Date:                   Patient Goals/Self-Care Activities:  . Keep appointments with  [facility]  . Talk to your therapist and share your feelings and concerns  . Keep appointment with Dr. Perley Jain 09/27/20 to discuss concerns with the medication   Why is this important?    Beating depression may take some time.   If you don't feel better right away, don't give up on your treatment plan.      Patient verbalizes understanding of instructions provided today and agrees to view in MyChart.   The care management team will reach out to the patient again over the next 14 to 21  days. as needed  Sammuel Hines, LCSW [phone]

## 2020-09-17 NOTE — Telephone Encounter (Signed)
Disability Forms form dropped off for at front desk for completion.  Verified that patient section of form has been completed.  Last DOS/WCC with PCP was 08/08/20.  Placed form in team folder to be completed by clinical staff.  Vilinda Blanks

## 2020-09-18 NOTE — Progress Notes (Signed)
Referral sent to [facility] for psychiatry referral

## 2020-09-18 NOTE — Telephone Encounter (Signed)
Clinical info completed on Disability form.  Place form in PCP's box for completion.  Miroslav Gin, CMA  

## 2020-09-19 NOTE — Telephone Encounter (Signed)
Patient contacted and informed of form ready for pick up. A copy was made for batch scanning as well.

## 2020-09-19 NOTE — Telephone Encounter (Signed)
Disability And Leave Physician Statement from [facility], along with relevant office visit notes, completed and given to Nursing staff in wall pocket of front office

## 2020-09-27 ENCOUNTER — Other Ambulatory Visit (HOSPITAL_COMMUNITY)
Admission: RE | Admit: 2020-09-27 | Discharge: 2020-09-27 | Disposition: A | Discharge status: Home / Self Care | Payer: 59 | Source: Ambulatory Visit | Attending: Family Medicine | Admitting: Family Medicine

## 2020-09-27 ENCOUNTER — Ambulatory Visit (INDEPENDENT_AMBULATORY_CARE_PROVIDER_SITE_OTHER): Payer: 59 | Admitting: Family Medicine

## 2020-09-27 ENCOUNTER — Other Ambulatory Visit: Payer: Self-pay

## 2020-09-27 ENCOUNTER — Encounter: Payer: Self-pay | Admitting: Family Medicine

## 2020-09-27 VITALS — BP 114/70 | HR 70 | Ht 62.0 in | Wt 333.0 lb

## 2020-09-27 DIAGNOSIS — A64 Unspecified sexually transmitted disease: Secondary | ICD-10-CM | POA: Diagnosis not present

## 2020-09-27 DIAGNOSIS — F5102 Adjustment insomnia: Secondary | ICD-10-CM | POA: Diagnosis not present

## 2020-09-27 DIAGNOSIS — F339 Major depressive disorder, recurrent, unspecified: Secondary | ICD-10-CM

## 2020-09-27 DIAGNOSIS — F321 Major depressive disorder, single episode, moderate: Secondary | ICD-10-CM | POA: Diagnosis not present

## 2020-09-27 MED ORDER — BUPROPION HCL ER (SR) 150 MG PO TB12: 150.0000 mg | ORAL_TABLET | Freq: Every day | ORAL | 5 refills | Status: DC

## 2020-09-27 NOTE — Patient Instructions (Signed)
Start Wellbutrin (Bupropion) XR 150 mg in the morning by mouth Continue taking your Lexapro (escitalopram) every day.  We will work with you to get an appointment with a psychiatrist to help with your depression medications and a therapist to provide psychotherapy.    We will check you next month.   When you're going through tough times, it's easy to feel lonely and overwhelmed. Remember that YOU ARE NOT ALONE and we at [facility] want to support you during this difficult time.  There are many ways to get help and support when you are ready.  You can call the following help lines: - National suicide hotline (713-779-2490) - National Hopeline Network (1-800-SUICIDE)   You can schedule an appointment with a team member at [facility] - your primary care doc or one of our counselors.  We are all here to support you. A doctor is on call 24/7, so call us if you need to [phone]).   There are many treatments that can help people during difficult times, and we can talk with you about medications, counseling, diet, and other options. We want to offer you HOPE that it won't always feel this bad.   If you are seriously thinking about hurting yourself or have a plan, please call 911 or go to any emergency room right away for immediate help.

## 2020-09-27 NOTE — Progress Notes (Signed)
Paula Drake is alone Sources of clinical information for visit is/are patient and past medical records. Nursing assessment for this office visit was reviewed with the patient for accuracy and revision.     Previous Report(s) Reviewed: office notes  Depression screen [facility] 2/9 09/27/2020  Decreased Interest 3  Down, Depressed, Hopeless 3  PHQ - 2 Score 6  Altered sleeping 3  Tired, decreased energy 3  Change in appetite 3  Feeling bad or failure about yourself  3  Trouble concentrating 3  Moving slowly or fidgety/restless 0  Suicidal thoughts 1  PHQ-9 Score 22  Difficult doing work/chores Very difficult    Fall Risk  01/19/2017 10/30/2016 05/16/2016 09/11/2015 10/11/2014  Falls in the past year? No No No No No    PHQ9 SCORE ONLY 09/27/2020 07/26/2020 06/28/2020  PHQ-9 Total Score 22 13 10     Adult vaccines due  Topic Date Due  . TETANUS/TDAP  11/03/2017    Health Maintenance Due  Topic Date Due  . COVID-19 Vaccine (1) Never done  . Hepatitis C Screening  Never done  . TETANUS/TDAP  11/03/2017      History/P.E. limitations: none  Adult vaccines due  Topic Date Due  . TETANUS/TDAP  11/03/2017   There are no preventive care reminders to display for this patient.  Health Maintenance Due  Topic Date Due  . COVID-19 Vaccine (1) Never done  . Hepatitis C Screening  Never done  . TETANUS/TDAP  11/03/2017     Chief Complaint  Patient presents with  . Depression

## 2020-09-28 ENCOUNTER — Telehealth: Payer: Self-pay | Admitting: *Deleted

## 2020-09-28 ENCOUNTER — Encounter: Payer: Self-pay | Admitting: Family Medicine

## 2020-09-28 DIAGNOSIS — A64 Unspecified sexually transmitted disease: Secondary | ICD-10-CM | POA: Insufficient documentation

## 2020-09-28 HISTORY — DX: Unspecified sexually transmitted disease: A64

## 2020-09-28 LAB — URINE CYTOLOGY ANCILLARY ONLY
Chlamydia: NEGATIVE
Comment: NEGATIVE
Comment: NORMAL
Neisseria Gonorrhea: NEGATIVE

## 2020-09-28 NOTE — Assessment & Plan Note (Addendum)
Established problem Depression screen [facility] 2/9 09/27/2020 07/26/2020 06/28/2020 05/28/2017 01/19/2017  Decreased Interest 3 1 1  0 0  Down, Depressed, Hopeless 3 1 1  0 0  PHQ - 2 Score 6 2 2  0 0  Altered sleeping 3 2 1  - -  Tired, decreased energy 3 1 1  - -  Change in appetite 3 1 0 - -  Feeling bad or failure about yourself  3 3 3  - -  Trouble concentrating 3 3 3  - -  Moving slowly or fidgety/restless 0 0 0 - -  Suicidal thoughts 1 1 - - -  PHQ-9 Score 22 13 10  - -  Difficult doing work/chores Very difficult - Extremely dIfficult - -  Uncontrolled Patient thinks of death, but denies intention of harming herself.   Ms Fiorillo is frustrated by the lack of improvement in how she feels despite taking her escitalopram as directed and participating in psychotherapy.  Her moving in with her mother is associated with increased interpersonal tension and stress between Ms Reposa and her mother and between Ms Billard and her sister.  She perceives them as not understanding her illness and insitence that she work.  Ms Coote has withdrawn into her room and wants to sleep the day away.   She has distressing thoughts about her being unclean and shameful because of the past infection with chlamydia.  These unwanted thoughts include the distress from the betrayal by her partner of six years.  She reports taking showers three times a day to wash away the feelings of dirtiness.  Ms Campau feels disappointed in her relationship with her current therapist. She wishes to change therapists.   Physical Gen: tearful, mild distress Psych: oriented, fair insight, speech clear and prosodic, soft voice, Language goal-directed and logical, Mood depressed, affect varies between tearful,sad and angry, cooperative, groomed, fair eye contact,   A/ Resistant Major Depression, Moderate, single episode - no evidence of response to current single antidepressant medication and psychotherapy combination treatment.  After discussion with Ms  Loving, the treatment plan is updated - Add adjuvant antidepressant medication, Bupropion 150 mg XL daily each morning , to current escitalopram 20 mg daily.    - Referral to Psychiatry for evaluation of diagnosis and management of psychotropic therapy.  Ms Mccay has private insurance which may give more choices of psychiatrist. - Change in psychotherapist - Ms Gillen requests that Ms Jennette Kettle, assist her in the process of navigating the healthcare system to make an initial consultation appointment with a psychitrist and to change her psychotherapist.  - I will see Ms Fors back in 4 weeks.  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

## 2020-09-28 NOTE — Chronic Care Management (AMB) (Signed)
  Care Management   Note  09/28/2020 Name: Paula Drake MRN: [id number] DOB: [birth year]  Paula Drake is a 30 y.o. year old female who is a primary care patient of McDiarmid, Leighton Roach, MD. I reached out to Paula Drake by phone today in response to a referral sent by Paula Drake PCP, McDiarmid, Leighton Roach, MD.    Paula Drake was given information about care management services today including:  1. Care management services include personalized support from designated clinical staff supervised by her physician, including individualized plan of care and coordination with other care providers 2. 24/7 contact phone numbers for assistance for urgent and routine care needs. 3. The patient may stop care management services at any time by phone call to the office staff.  Patient agreed to services and verbal consent obtained.   Follow up plan: Telephone appointment with care management team member scheduled for:10/02/2020  [facility] Guide, Embedded Care Coordination [facility] Management

## 2020-09-28 NOTE — Assessment & Plan Note (Signed)
  Do not plan to refill trazodone.

## 2020-09-28 NOTE — Assessment & Plan Note (Signed)
Asymptomatic Not sexually active.  Follow up testing of chlamydia infection. (treated)

## 2020-10-02 ENCOUNTER — Ambulatory Visit: Payer: 59 | Admitting: Licensed Clinical Social Worker

## 2020-10-02 DIAGNOSIS — R4589 Other symptoms and signs involving emotional state: Secondary | ICD-10-CM

## 2020-10-02 DIAGNOSIS — Z7189 Other specified counseling: Secondary | ICD-10-CM

## 2020-10-02 NOTE — Patient Instructions (Signed)
Visit Information  Goals Addressed            This Visit's Progress   . Stick with Counseling   On track    Timeframe:  Short-Term Goal Priority:  High Start Date:  07/05/20                           Expected End Date:                   Patient Goals/Self-Care Activities:  . Avoid negative self-talk, practice positive thinking and self-talk . Review your list of goals discussed today:  "Do first and feel second"  I have placed a referral with [facility] to assist with connecting you with a mental health provider. they will contact you once a provider is located.  Review your EMMI educational information on Depression   Why is this important?    Beating depression may take some time.   If you don't feel better right away, don't give up on your treatment plan.      Patient verbalizes understanding of instructions provided today and agrees to view in MyChart.   Telephone follow up appointment with care management team member scheduled for:  Sammuel Hines, [facility] Care Management & Coordination  [phone]

## 2020-10-02 NOTE — Chronic Care Management (AMB) (Signed)
Care Management   Clinical Social Work Note  10/02/2020 Name: ROBEN TATSCH MRN: [id number] DOB: 11-30-1990  Paula Drake is a 30 y.o. year old female who is a primary care patient of McDiarmid, Leighton Roach, MD. The CCM team was consulted to assist the patient with chronic disease management and/or care coordination needs related to: Mental Health Counseling and Resources.   Engaged with patient by telephone for follow up visit in response to provider referral for social work chronic care management and care coordination services.   Consent to Services:  The patient was given information about Chronic Care Management services, agreed to services, and gave verbal consent prior to initiation of services.  Please see initial visit note for detailed documentation.   Patient agreed to services and consent obtained.   Assessment: Patient continues to experience difficulty with managing symptoms of depression... See Care Plan below for interventions and patient self-care actives. Recent life changes /stressors: relationship difficulties Recommendation: Patient may benefit from, and is in agreement to allow LCSW to place referral for mental heath provider via [place] system .  Follow up Plan: Patient would like continued follow-up.  CCM LCSW will follow up with patient 1 week to provider bridge interventions until connected for ongoing counseling.. Patient will call office if needed prior to next encounter.    Review of patient past medical history, allergies, medications, and health status, including review of relevant consultants reports was performed today as part of a comprehensive evaluation and provision of chronic care management and care coordination services.     SDOH (Social Determinants of Health) assessments and interventions performed:    Advanced Directives Status: Not addressed in this encounter.  CCM Care Plan  No Known Allergies  Outpatient Encounter Medications as of 10/02/2020   Medication Sig  . buPROPion (WELLBUTRIN SR) 150 MG 12 hr tablet Take 1 tablet (150 mg total) by mouth daily.  Marland Kitchen escitalopram (LEXAPRO) 20 MG tablet Take 1 tablet (20 mg total) by mouth daily.  Marland Kitchen ibuprofen (ADVIL,MOTRIN) 200 MG tablet Take 400 mg by mouth every 6 (six) hours as needed for headache.  . traZODone (DESYREL) 50 MG tablet Take 1.5 tablets (75 mg total) by mouth at bedtime as needed for sleep.   No facility-administered encounter medications on file as of 10/02/2020.    Patient Active Problem List   Diagnosis Date Noted  . STI (sexually transmitted infection) 09/28/2020  . Subcutaneous nodule 07/27/2020  . Depression, major, single episode, moderate (HCC) 06/29/2020  . Adjustment insomnia 06/29/2020  . Abnormal uterine bleeding 06/29/2020  . Contraception 05/17/2013  . OBESITY, MORBID 11/04/2007  . ELEVATED BLOOD PRESSURE 11/04/2007    Conditions to be addressed/monitored: Depression;  Care Plan : Clinical Social Work  Updates made by Soundra Pilon, LCSW since 10/02/2020 12:00 AM  Problem: Depression   Goal: Decrease and manage depressive symptoms   Start Date: 07/05/2020  This Visit's Progress: On track  Recent Progress: On track  Priority: High   Current barriers:    . Acute Mental Health needs related to symptoms of depression   Currently unable to independently self manage needs related to mental health conditions. . Needs Support, and Care Coordination in order to meet unmet need.  Clinical Goal(s) patient will continue to reduce and manage symptoms of depression, insomnia, and stress via ongoing counseling.  Clinical Interventions:  . Assessed patient's needs, progress and barriers to care with new counselor and current medication  . Patient continues to have difficulty  with therapist at [facility] and would like assisting with locating a new provider   . Other interventions: Depression screen reviewed , Active listening / Reflection utilized ,  Emotional Supportive Provided, Psychoeducation /Health Education, Brief CBT , Reviewed mental health medications with patient and discussed compliance: Patient has not picked up Wellbutrin (Bupropion) XR 150 mg she will pick it up Friday , Participation in counseling encouraged , Provided EMMI education information on Depression , and Suicidal Ideation/Homicidal Ideation assessed: ; . Discussed several options for long term counseling based on need and insurance. Referral placed for medication management and ongoing therapy with [facility]  . Inter-disciplinary care team collaboration (see longitudinal plan of care) Patient Goals/Self-Care Activities: Over the next 14 days .  - avoid negative self-talk, practice positive thinking and self-talk . Review your list of goals discussed today:  "Do first and feel second" . I have placed a referral with [facility] to assist with connecting you with a mental health provider. they will contact you once a provider is located. . Review your EMMI educational information on Depression        Sammuel Hines, LCSW Care Management & Coordination  [facility] / [facility]   [phone] 12:02 PM

## 2020-10-09 ENCOUNTER — Ambulatory Visit: Payer: 59 | Admitting: Licensed Clinical Social Worker

## 2020-10-09 DIAGNOSIS — R4589 Other symptoms and signs involving emotional state: Secondary | ICD-10-CM

## 2020-10-09 NOTE — Patient Instructions (Signed)
Visit Information   Goals Addressed             This Visit's Progress    Stick with Counseling   On track    Timeframe:  Short-Term Goal Priority:  High Start Date:  07/05/20                           Expected End Date:                   Patient Goals/Self-Care Activities:  Avoid negative self-talk, practice positive thinking and self-talk Review your list of goals discussed today:  "Do first and feel second" Keep appointment June 22nd with [facility] provider. Review your EMMI educational information on Depression   Why is this important?   Beating depression may take some time.  If you don't feel better right away, don't give up on your treatment plan.       Patient verbalizes understanding of instructions provided today and agrees to view in MyChart.   Telephone follow up appointment with care management team member scheduled for:10/24/2020  Sammuel Hines, LCSW Care Management & Coordination  [phone]

## 2020-10-09 NOTE — Chronic Care Management (AMB) (Signed)
Care Management   Clinical Social Work Note  10/09/2020 Name: Paula Drake MRN: [id number] DOB: May 24, 1990  Paula Drake is a 30 y.o. year old female who is a primary care patient of McDiarmid, Leighton Roach, MD. The CCM team was consulted to assist the patient with chronic disease management and/or care coordination needs related to: Mental Health Counseling and Resources.   Engaged with patient by telephone for follow up visit in response to provider referral for social work chronic care management and care coordination services.   Consent to Services: Patient agreed to services and consent obtained.   Assessment: Patient continues to experience difficulty with managing symptoms of depression.  She is connected with a new mental health provider [facility] for counseling and medication management.Marland KitchenAppointment scheduled for June 22nd . See Care Plan below for interventions and patient self-care actives.  Follow up Plan: Patient would like continued follow-up.  CCM LCSW will follow up with patient 2 weeks. Patient will call office if needed prior to next encounter.   Review of patient past medical history, allergies, medications, and health status, including review of relevant consultants reports was performed today as part of a comprehensive evaluation and provision of chronic care management and care coordination services.     SDOH (Social Determinants of Health) assessments and interventions performed:    Advanced Directives Status: Not addressed in this encounter.  CCM Care Plan  No Known Allergies  Outpatient Encounter Medications as of 10/09/2020  Medication Sig   buPROPion (WELLBUTRIN SR) 150 MG 12 hr tablet Take 1 tablet (150 mg total) by mouth daily.   escitalopram (LEXAPRO) 20 MG tablet Take 1 tablet (20 mg total) by mouth daily.   ibuprofen (ADVIL,MOTRIN) 200 MG tablet Take 400 mg by mouth every 6 (six) hours as needed for headache.   traZODone (DESYREL) 50 MG tablet Take 1.5  tablets (75 mg total) by mouth at bedtime as needed for sleep.   No facility-administered encounter medications on file as of 10/09/2020.    Patient Active Problem List   Diagnosis Date Noted   STI (sexually transmitted infection) 09/28/2020   Subcutaneous nodule 07/27/2020   Depression, major, single episode, moderate (HCC) 06/29/2020   Adjustment insomnia 06/29/2020   Abnormal uterine bleeding 06/29/2020   Contraception 05/17/2013   OBESITY, MORBID 11/04/2007   ELEVATED BLOOD PRESSURE 11/04/2007    Conditions to be addressed/monitored: Anxiety and Depression; Mental Health Concerns   Care Plan : Clinical Social Work  Updates made by Soundra Pilon, LCSW since 10/09/2020 12:00 AM   Problem: Depression    Goal: Decrease and manage depressive symptoms   Start Date: 07/05/2020  This Visit's Progress: On track  Recent Progress: On track  Priority: High   Current barriers:    Acute Mental Health needs related to symptoms of depression  Currently unable to independently self manage needs related to mental health conditions. Needs Support, and Care Coordination in order to meet unmet need.  Clinical Goal(s) patient will continue to reduce and manage symptoms of depression, insomnia, and stress via ongoing counseling.  Clinical Interventions:  Assessed patient's needs, progress and barriers to care with connecting with new counselor and medication management Patient has appointment June 22nd with mental health provider for counseling and medication evaluation.   Other interventions: Active listening / Reflection utilized , Emotional Supportive Provided,  Reviewed mental health medications with patient and discussed compliance: Patient has not picked up Wellbutrin (Bupropion) XR 150 mg she will pick it up Friday  Participation in counseling encouraged ,  Discussed several options for long term counseling based on need and insurance. Referral placed for medication management and ongoing  therapy with [facility]  Inter-disciplinary care team collaboration (see longitudinal plan of care) Patient Goals/Self-Care Activities: Over the next 14 days  Avoid negative self-talk, practice positive thinking and self-talk Review your list of goals discussed today:  "Do first and feel second" Keep appointment June 22nd with [facility] provider. Review your EMMI educational information on Depression      Sammuel Hines, LCSW Care Management & Coordination  [facility] / [facility]   [phone] 11:12 AM

## 2020-10-18 ENCOUNTER — Telehealth: Payer: Self-pay | Admitting: Family Medicine

## 2020-10-18 NOTE — Telephone Encounter (Signed)
*  AFTER HOURS CALL*  Patient reports that she woke up feeling hot, nauseous and dizzy. She reports that she is on a depression medication including wellbutrin and lexapro. She has not been able to take a temperature. She felt fine when she went to bed. She reports that she is continuing to feel the symptoms. She reports that she had close contact with a person on Saturday who recently tested positive for COVID yesterday. She was not masked during this encounter, estimates encounter. She denies emesis. She denies SOB, diarrhea, GERD, denies recreational drug use, denies alcohol. She reports that she has not smoked. No chest pain. +HA without blurry vision. Her main complaint is the persistent dizziness that does not change with positional changes.   Patient recommended to be evaluated in the ED given her persistent dizziness that may be due to hypoxia. She reports that she does not have anyone who can drive her to the ED at this time but feels she will be able to drive herself there. Also discussed that given close contact with COVID+ individual she would benefit from covid testing. Patient has no pulse oximetry device so unable to evaluate for oxygenation status via telephone. Low suspicion for dehydration given normal PO intake prior to going to last night.   Ronnald Ramp, MD  [facility] Service, PGY-2  FPTS Intern Pager [phone]

## 2020-10-23 ENCOUNTER — Telehealth: Payer: Self-pay | Admitting: Family Medicine

## 2020-10-23 NOTE — Telephone Encounter (Signed)
Patient came in to drop off disability forms to be completed by doctor. Last DOS: 09/27/2020. Patient would like a phone call when forms are ready to be picked up [phone]. Placing forms in the blue folder. Thanks!

## 2020-10-24 ENCOUNTER — Ambulatory Visit: Payer: 59 | Admitting: Licensed Clinical Social Worker

## 2020-10-24 NOTE — Chronic Care Management (AMB) (Signed)
    Clinical Social Work  Care Management   Phone Outreach    10/24/2020 Name: Paula Drake MRN: [id number] DOB: [birth year]  Paula Drake is a 31 y.o. year old female who is a primary care patient of McDiarmid, Leighton Roach, MD .   F/U phone call today to assess needs, and progress with care plan goals.   Unable to keep phone appointment today and requested to reschedule.  Plan:Appointment was rescheduled with CCM LCSW Thursday June 30th 10:00  Review of patient status, including review of consultants reports, relevant laboratory and other test results, and collaboration with appropriate care team members and the patient's provider was performed as part of comprehensive patient evaluation and provision of care management services.     Sammuel Hines, LCSW Care Management & Coordination  [facility] / [facility]   [phone] 1:38 PM

## 2020-10-24 NOTE — Patient Instructions (Signed)
   Per your request, phone appointment with me has been rescheduled for Thursday June 30th at 10:00  Sammuel Hines, LCSW Care Management & Coordination  [phone]

## 2020-10-25 ENCOUNTER — Ambulatory Visit: Payer: 59 | Admitting: Licensed Clinical Social Worker

## 2020-10-25 DIAGNOSIS — Z719 Counseling, unspecified: Secondary | ICD-10-CM

## 2020-10-25 DIAGNOSIS — Z7689 Persons encountering health services in other specified circumstances: Secondary | ICD-10-CM

## 2020-10-25 NOTE — Chronic Care Management (AMB) (Signed)
Care Management   Clinical Social Work Note  10/25/2020 Name: Paula Drake MRN: [id number] DOB: December 11, 1990  Paula Drake is a 30 y.o. year old female who is a primary care patient of McDiarmid, Leighton Roach, MD. The CCM team was consulted to assist the patient with chronic disease management and/or care coordination needs related to: Mental Health Counseling and Resources.   Engaged with patient by telephone for initial visit in response to provider referral for social work chronic care management and care coordination services.   Consent to Services: Patient agreed to services and consent obtained.   Assessment: Patient is making progress with and has connected with her therapist and provider for medication management. New provider changed medication . See Care Plan below for interventions and patient self-care actives.  Recommendation: Patient may benefit from, and is in agreement to continue with scheduled appointments with therapist and medication management.  Follow up Plan: Patient would like continued follow-up from CCM LCSW .  Follow up scheduled in 5 weeks. Patient will call office if needed prior to next encounter.    Review of patient past medical history, allergies, medications, and health status, including review of relevant consultants reports was performed today as part of a comprehensive evaluation and provision of chronic care management and care coordination services.     SDOH (Social Determinants of Health) assessments and interventions performed:    Advanced Directives Status: Not addressed in this encounter.  CCM Care Plan  No Known Allergies  Outpatient Encounter Medications as of 10/25/2020  Medication Sig   buPROPion (WELLBUTRIN SR) 150 MG 12 hr tablet Take 1 tablet (150 mg total) by mouth daily.   escitalopram (LEXAPRO) 20 MG tablet Take 1 tablet (20 mg total) by mouth daily.   ibuprofen (ADVIL,MOTRIN) 200 MG tablet Take 400 mg by mouth every 6 (six) hours as needed  for headache.   traZODone (DESYREL) 50 MG tablet Take 1.5 tablets (75 mg total) by mouth at bedtime as needed for sleep.   No facility-administered encounter medications on file as of 10/25/2020.    Patient Active Problem List   Diagnosis Date Noted   STI (sexually transmitted infection) 09/28/2020   Subcutaneous nodule 07/27/2020   Depression, major, single episode, moderate (HCC) 06/29/2020   Adjustment insomnia 06/29/2020   Abnormal uterine bleeding 06/29/2020   Contraception 05/17/2013   OBESITY, MORBID 11/04/2007   ELEVATED BLOOD PRESSURE 11/04/2007    Conditions to be addressed/monitored: Depression;   Care Plan : Clinical Social Work  Updates made by Soundra Pilon, LCSW since 10/25/2020 12:00 AM   Problem: Depression    Goal: Decrease and manage depressive symptoms   Start Date: 07/05/2020  This Visit's Progress: On track  Recent Progress: On track  Priority: High   Current barriers:    Acute Mental Health needs related to symptoms of depression  Currently unable to independently self manage needs related to mental health conditions. Needs Support, and Care Coordination in order to meet unmet need.  Clinical Goal(s) patient will continue to reduce and manage symptoms of depression, insomnia, and stress via ongoing counseling.  Clinical Interventions:  Assessed patient's needs, discussed progress and barriers to care with connecting with new counselor and medication management Patient completed appointment June 22nd with mental health provider for counseling and medication evaluation.   : Active listening / Reflection utilized , Emotional Supportive Provided,  Reviewed mental health medications with patient and discussed compliance: New provider changed medication: continue with Wellbutrin (Bupropion) XR 150 mg:  stopped Lexapro and added Prozac 10 mg. Taking Trazadone as needed.  Inter-disciplinary care team collaboration (see longitudinal plan of care) Patient  Goals/Self-Care Activities: Over the next 30 days practice positive thinking and self-talk Review your list of goals discussed today:  "Do first and feel second" Keep appointment July 6th and July 21 with mental health providers.      Sammuel Hines, LCSW Care Management & Coordination  [facility] / [facility]   [phone] 10:37 AM

## 2020-10-25 NOTE — Patient Instructions (Signed)
Visit Information   Goals Addressed             This Visit's Progress    Stick with Counseling       Timeframe:  Short-Term Goal Priority:  High Start Date:  07/05/20                           Expected End Date:                   Patient Goals/Self-Care Activities:  practice positive thinking and self-talk Review your list of goals discussed today:  "Do first and feel second" Keep appointment July 6th and July 21 with mental health providers.  Why is this important?   Beating depression may take some time.  If you don't feel better right away, don't give up on your treatment plan.       Patient verbalizes understanding of instructions provided today and agrees to view in MyChart.   Telephone follow up appointment with care management team member scheduled for:11/27/2020  Sammuel Hines, LCSW Care Management & Coordination  [phone]

## 2020-10-30 ENCOUNTER — Ambulatory Visit: Payer: Self-pay | Admitting: Licensed Clinical Social Worker

## 2020-10-30 ENCOUNTER — Other Ambulatory Visit: Payer: Self-pay | Admitting: Family Medicine

## 2020-10-30 DIAGNOSIS — Z7189 Other specified counseling: Secondary | ICD-10-CM

## 2020-10-30 NOTE — Chronic Care Management (AMB) (Signed)
  Care Management  Collaboration  Note  10/30/2020 Name: Paula Drake MRN: [id number] DOB: 1990/10/16  Paula Drake is a 30 y.o. year old female who is a primary care patient of McDiarmid, Leighton Roach, MD. The CCM team was consulted reference care coordination needs for Mental Health Counseling and Resources.  Assessment: Patient was not interviewed or contacted during this encounter. CCM LCSW collaborated with PCP and Mental Health provider, copy of care plan added to LCSW care plan below.  LCSW shared information with PCP.  See Care Plan below for interventions and patient self-care actives.   Intervention:Conducted brief assessment, recommendations and relevant information discussed.    Follow up Plan: Patient would like continued follow-up from CCM LCSW .  Follow up scheduled in 4 weeks. Patient will call office if needed prior to next encounter.   Collaboration with McDiarmid, Leighton Roach, MD regarding development and update of comprehensive plan of care as evidenced by provider attestation and co-signature  Review of patient past medical history, allergies, medications, and health status, including review of pertinent consultant reports was performed as part of comprehensive evaluation and provision of care management/care coordination services.   Care Plan Conditions to be addressed/monitored per PCP order: Depression, Mental Health Concerns   Patient Care Plan: Clinical Social Work   Problem Identified: Depression    Goal: Decrease and manage depressive symptoms   Start Date: 07/05/2020  This Visit's Progress: On track  Recent Progress: On track  Priority: High   Current barriers:    Acute Mental Health needs related to symptoms of depression  Currently unable to independently self manage needs related to mental health conditions. Needs Support, and Care Coordination in order to meet unmet need.  Clinical Goal(s) patient will continue to reduce and manage symptoms of depression, insomnia,  and stress via ongoing counseling.  Clinical Interventions:  Assessed patient's needs, discussed progress and barriers to care with connecting with new counselor and medication management Patient completed appointment June 22nd with mental health provider for counseling and medication evaluation.   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 Questions were welcomed  and answered.  Note: Copied From Quartet Patient  Inter-disciplinary care team collaboration (see longitudinal plan of care) Patient Goals/Self-Care Activities: Over the next 30 days practice positive thinking and self-talk Review your list of goals discussed today:  "Do first and feel second" Keep appointment July 6th and July 21 with mental health providers.      [email or web address], LCSW Care Management & Coordination  [facility] / [facility]   [phone] 10:03 AM

## 2020-11-01 NOTE — Telephone Encounter (Signed)
Patient is calling to check the status of paperwork that was dropping off. Please advise. Thanks!

## 2020-11-02 NOTE — Telephone Encounter (Signed)
Paperwork has been placed up front for pick up and a copy was made for batch scanning. The patient has been made aware.

## 2020-11-02 NOTE — Telephone Encounter (Signed)
Disability paperwork is complete and ready for pickup.

## 2020-11-27 ENCOUNTER — Ambulatory Visit: Payer: 59 | Admitting: Licensed Clinical Social Worker

## 2020-11-27 DIAGNOSIS — Z789 Other specified health status: Secondary | ICD-10-CM

## 2020-11-27 NOTE — Patient Instructions (Signed)
  Per your request we have rescheduled your phone appointment for  Monday Aug. 8th   Sammuel Hines, LCSW Care Management & Coordination  [facility] / [facility] [employer]   [phone]

## 2020-11-27 NOTE — Chronic Care Management (AMB) (Signed)
    Clinical Social Work  Care Management   Phone Outreach    11/27/2020 Name: SHERETTA GRUMBINE MRN: [id number] DOB: 1991/01/27  ARLINE KETTER is a 30 y.o. year old female who is a primary care patient of McDiarmid, Leighton Roach, MD .   F/U phone call today to assess needs, progress and barriers with care plan goals.   Unable to keep phone appointment today and requested to reschedule.  Plan:Appointment was rescheduled with CCM LCSW for Monday Aug 8th   Review of patient status, including review of consultants reports, relevant laboratory and other test results, and collaboration with appropriate care team members and the patient's provider was performed as part of comprehensive patient evaluation and provision of care management services.     Sammuel Hines, LCSW Care Management & Coordination  [facility] / [facility]   [phone] 10:21 AM

## 2020-12-03 ENCOUNTER — Ambulatory Visit: Payer: 59 | Admitting: Licensed Clinical Social Worker

## 2020-12-03 DIAGNOSIS — Z7189 Other specified counseling: Secondary | ICD-10-CM

## 2020-12-03 NOTE — Patient Instructions (Signed)
Visit Information   Goals Addressed             This Visit's Progress    complete Advance Directive       Patient Goals/Self-Care Activities : Over the next 30 days Review educational information on Advance Directive from [facility] Education video Complete Advance Directive packet,  Have advance directive notarized and provide a copy to provider office Call if you have questions      Stick with Counseling   On track    Timeframe:  Short-Term Goal Priority:  High Start Date:  07/05/20                           Expected End Date:                   Patient Goals/Self-Care Activities:  I am so glad you are starting to feel better Keep appointments with mental health providers.  Why is this important?   Beating depression may take some time.  If you don't feel better right away, don't give up on your treatment plan.       Patient verbalizes understanding of instructions provided today and agrees to view in MyChart.   Telephone follow up appointment with care management team member scheduled for: 12/24/2020  Sammuel Hines, [facility] Care Management & Coordination  [phone]

## 2020-12-03 NOTE — Chronic Care Management (AMB) (Signed)
Care Management   Clinical Social Work Note  12/03/2020 Name: Paula Drake MRN: [id number] DOB: 1990-10-20  Paula Drake is a 30 y.o. year old female who is a primary care patient of McDiarmid, Leighton Roach, MD. The CCM team was consulted to assist the patient with chronic disease management and/or care coordination needs related to: Art therapist and Mental Health Counseling and Resources.   Engaged with patient by telephone for follow up visit in response to provider referral for social work chronic care management and care coordination services.   Consent to Services:  The patient was given information about Chronic Care Management services, agreed to services, and gave verbal consent prior to initiation of services.  Please see initial visit note for detailed documentation.   Patient agreed to services and consent obtained.   Assessment: Patient is engaged in conversation and is making progress with managing symptoms of depression. Reports she is starting to feel better and decrease in crying.  She likes her new therapist and provider for medication management.  Mental health provider Georga Kaufmann also noted that patient is making progress. . See Care Plan below for interventions and patient self-care actives.  Recent life changes Paula Drake: patient reports continued increase in headaches( she has discussed this concern with medication management provider and has an appointment with PCP 12/13/2020 and medication management provided 12/11/2020   Recommendation: Patient may benefit from, and is in agreement to keep a journal of her headache( when, how frequent , how intense, what makes is better, what makes it worse. Will share notes with both providers next week.   Follow up Plan: Patient would like continued follow-up from CCM LCSW .  Follow up scheduled in 3 weeks 12/24/2020. Patient will call office if needed prior to next encounter.     Review of patient past medical history,  allergies, medications, and health status, including review of relevant consultants reports was performed today as part of a comprehensive evaluation and provision of chronic care management and care coordination services.     SDOH (Social Determinants of Health) assessments and interventions performed:    Advanced Directives Status: See Care Plan for related entries.  CCM Care Plan  No Known Allergies  Outpatient Encounter Medications as of 12/03/2020  Medication Sig   buPROPion (WELLBUTRIN SR) 150 MG 12 hr tablet Take 1 tablet (150 mg total) by mouth daily.   FLUoxetine (PROZAC) 20 MG tablet Take 20 mg by mouth daily.   ibuprofen (ADVIL,MOTRIN) 200 MG tablet Take 400 mg by mouth every 6 (six) hours as needed for headache.   traZODone (DESYREL) 50 MG tablet Take 1.5 tablets (75 mg total) by mouth at bedtime as needed for sleep.   No facility-administered encounter medications on file as of 12/03/2020.    Patient Active Problem List   Diagnosis Date Noted   STI (sexually transmitted infection) 09/28/2020   Subcutaneous nodule 07/27/2020   Depression, major, single episode, moderate (HCC) 06/29/2020   Adjustment insomnia 06/29/2020   Abnormal uterine bleeding 06/29/2020   Contraception 05/17/2013   OBESITY, MORBID 11/04/2007   ELEVATED BLOOD PRESSURE 11/04/2007    Conditions to be addressed/monitored: Depression;  Advance Directive  Care Plan : Clinical Social Work  Updates made by Soundra Pilon, LCSW since 12/03/2020 12:00 AM     Problem: Depression      Goal: Decrease and manage depressive symptoms   Start Date: 07/05/2020  This Visit's Progress: On track  Recent Progress: On track  Priority:  High  Note:    Current barriers:    Acute Mental Health needs related to symptoms of depression  Currently unable to independently self manage needs related to mental health conditions. Needs Support, and Care Coordination in order to meet unmet need.  Clinical Goal(s) patient  will continue to reduce and manage symptoms of depression, insomnia, and stress via ongoing counseling and medication managment.  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 Questions were welcomed  and answered.  Note: Copied From Quartet Patient  Inter-disciplinary care team collaboration (see longitudinal plan of care) Patient Goals/Self-Care Activities: Over the next 30 days Keep appointments with mental health providers.     Problem: No Advance Directive in place      Goal: Effective Long-Term Care Planning via Advance Directive   Start Date: 12/03/2020  This Visit's Progress: On track  Note:   Current barriers:   Patient does not have an advance directive.  Needs education, support and coordination in order to meet this need. Clinical Goal(s): Over the next 30 days, the patient will review information on advance directive, complete advance directive  packet and have notarized.  Interventions: Inter-disciplinary care team collaboration (see longitudinal plan of care) Assessed understanding of Advance Directives. A voluntary discussion about advanced care planning including importance of advanced directives, healthcare proxy and living will was discussed with the patient.  Educational information on Advance Directives as well as an advance directive packet provided.  Patient Goals/Self-Care Activities : Over the next 30 days Review educational information on Advance Directive from [facility] Education video Complete Advance Directive packet,  Have advance directive notarized and provide a copy to provider office Call LCSW if you have questions       [email or web address], LCSW Care Management & Coordination  [facility] / [facility]   [phone] 11:14 AM

## 2020-12-13 ENCOUNTER — Ambulatory Visit (INDEPENDENT_AMBULATORY_CARE_PROVIDER_SITE_OTHER): Payer: 59 | Admitting: Family Medicine

## 2020-12-13 ENCOUNTER — Other Ambulatory Visit: Payer: Self-pay | Admitting: Family Medicine

## 2020-12-13 ENCOUNTER — Encounter: Payer: Self-pay | Admitting: Family Medicine

## 2020-12-13 ENCOUNTER — Other Ambulatory Visit: Payer: Self-pay

## 2020-12-13 VITALS — BP 113/80 | HR 80 | Ht 62.0 in | Wt 351.8 lb

## 2020-12-13 DIAGNOSIS — R519 Headache, unspecified: Secondary | ICD-10-CM | POA: Diagnosis not present

## 2020-12-13 DIAGNOSIS — G4489 Other headache syndrome: Secondary | ICD-10-CM

## 2020-12-13 DIAGNOSIS — F321 Major depressive disorder, single episode, moderate: Secondary | ICD-10-CM | POA: Diagnosis not present

## 2020-12-13 DIAGNOSIS — F5102 Adjustment insomnia: Secondary | ICD-10-CM

## 2020-12-13 MED ORDER — MELOXICAM 15 MG PO TABS: 30.0000 mg | ORAL_TABLET | Freq: Every day | ORAL | 1 refills | Status: DC

## 2020-12-13 MED ORDER — PROPRANOLOL HCL ER 80 MG PO CP24: 80.0000 mg | ORAL_CAPSULE | Freq: Every day | ORAL | 1 refills | Status: DC

## 2020-12-13 NOTE — Progress Notes (Addendum)
Paula Drake is alone Sources of clinical information for visit is/are patient, past medical records, and Chronic Care Management notes. Nursing assessment for this office visit was reviewed with the patient for accuracy and revision.   Previous Report(s) Reviewed: office notes  Depression screen [facility] 2/9 12/13/2020  Decreased Interest 2  Down, Depressed, Hopeless 2  PHQ - 2 Score 4  Altered sleeping 2  Tired, decreased energy 2  Change in appetite 3  Feeling bad or failure about yourself  2  Trouble concentrating 2  Moving slowly or fidgety/restless 0  Suicidal thoughts 1  PHQ-9 Score 16  Difficult doing work/chores Very difficult   Flowsheet Row Office Visit from 12/13/2020 in [facility] Office Visit from 09/27/2020 in [facility] Office Visit from 07/26/2020 in [facility]  Thoughts that you would be better off dead, or of hurting yourself in some way Several days Several days Several days  PHQ-9 Total Score 16 22 13         Fall Risk  01/19/2017 10/30/2016 05/16/2016 09/11/2015 10/11/2014  Falls in the past year? No No No No No    PHQ9 SCORE ONLY 12/13/2020 09/27/2020 07/26/2020  PHQ-9 Total Score 16 22 13     Adult vaccines due  Topic Date Due   TETANUS/TDAP  12/13/2021 (Originally 11/03/2017)    Health Maintenance Due  Topic Date Due   Hepatitis C Screening  Never done   INFLUENZA VACCINE  11/26/2020      History/P.E. limitations: none   Adult vaccines due  Topic Date Due   TETANUS/TDAP  12/13/2021 (Originally 11/03/2017)   There are no preventive care reminders to display for this patient.  Health Maintenance Due  Topic Date Due   Hepatitis C Screening  Never done   INFLUENZA VACCINE  11/26/2020     Chief Complaint  Patient presents with   Blood Pressure Check   Headache    Pt stated that since she has been off the prozac her headaches are worst the before.    HEADACHE   Patient has had headaches  since teenager.  Always bilateral.   Worsening started before starting Prozac, and has had further worsening since she ran out of Prozac nearly a month ago.. Longstanding history of everyday headache - She has been taking BC powders daily for over the last two years. In the last several months she is taking two powders a day, one with waking in morning for her morning headache and then another around lunch time.  Headaches do not wake her up from sleep.   About the only time she does not have a headache is when she gets up at night to pee.   Recently, she stopped taking the [facility] Powders for two days because she was concerned about the effect of the caffeine on her.  Her headache pain intensified until she resumed the BCs.   She has not had headache relief with Ibuprofen nor Excedrin Extra-Strength.  Location: bilateral temples  Quality: aching, occasional throbbing Severity: moderated Duration: continuous with episodes of worsening Interference with usual activities: activity can be more difficult and fatiguing, but able to complete usual daily tasks Similarity to prior headaches: yes, her headache characteristics have not changed in general Precipitating factors: none identified - patient did not keep headache diary as suggested by Chronic Care Management on their last phone contact with patient.   Prior treatments: OTC analgesics Prior Diagnostic work-up: none  Bending over can  make worse Associated Symptoms Nausea/vomiting: no  Photophobia/phonophobia: no  Change in vision: no Change in speech: no Numbness or weakness in limb: no Tearing of eyes: no  Sinus pain/pressure: no  Family hx migraine: yes, sisters and mom with migraines  Personal stressors: yes  Relation to menstrual cycle: no   Red Flags Morning Headache: Yes Headache with coughing / sneezing / lifting: no Fever: no  Neck pain/stiffness: no Diplopia.no  Focal weakness/numbness: no  Trauma: no  New type of  headache: no  Anticoagulant use: no  H/o cancer/HIV/Pregnancy: no   Major Depression, single episode, moderate - Onset diagnosis 06/28/20. Trial of escitalopram 20 mg daily.  Addition of adjuvant Bupropion XL 150 mg daily.  Psychiatry consult stopped escitalopram and started Prozac 20 mg daily around 10/17/28 with a thirty day supply with no refills. .   - Patient reports that the follow up appointment with her psychiatrist about a month after their initial consultation visit was cancelled by the the psychiatrist.  No re-scheduled appointment was made.  Patient ran out of her Prozac.  She was unable to get a refill until the day prior to this office visit when the patient saw her psychiatrist.  [name] has restarted the Prozac.  She is fearful of re-experiencing the dizziness she felt previously with the initial start of Prozac back in June.   - [name] reports being engaged in therapy with her counselor.  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  - patient remains frustrated that she has not improved more.  Her days are spent at home, often sleeping much of the day alone in her room.  - While [name] thinks about death no infrequently, she denies any desire to harm herself.    PE Gen: Alert, Good eye contact, groomed, attentive HEENT: PERRL, fundoscopy showed normal disc-cup ration, no evidence of papilledema. EOMI Psych: Sad but engaged with interview, no tearfulness, good insight into her condition, stable affect that is congruent with her sad mood. Language is concrete and goal-directed. Speech is clear and prosodic.

## 2020-12-13 NOTE — Patient Instructions (Signed)
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   The treatment is to eventually have you stop the [facility] powders completely.    Please start taking meloxicam, 15 mg tablet, two tablets at bedtime with food.  This is a medication that is to replace the [facility] Powders.  Please start the Propranolol 80 mg taablet, one tablet at bedtime.  This is to help prevent headaches.  This medication will take at least 2 months to have it full effect.  Please keep taking it even if you continue to have daily headaches.   Dr Olivya Sobol will ask your HR department to start you back at 4 hour days.  We will decide how long before you can return to full days at future office visits.     It is OK to take these new medications with the Prozac.

## 2020-12-14 ENCOUNTER — Encounter: Payer: Self-pay | Admitting: Family Medicine

## 2020-12-14 ENCOUNTER — Other Ambulatory Visit: Payer: Self-pay

## 2020-12-14 ENCOUNTER — Telehealth: Payer: Self-pay

## 2020-12-14 DIAGNOSIS — R519 Headache, unspecified: Secondary | ICD-10-CM

## 2020-12-14 DIAGNOSIS — F5102 Adjustment insomnia: Secondary | ICD-10-CM

## 2020-12-14 HISTORY — DX: Headache, unspecified: R51.9

## 2020-12-14 MED ORDER — MELOXICAM 15 MG PO TABS: 15.0000 mg | ORAL_TABLET | Freq: Every day | ORAL | 0 refills | Status: DC

## 2020-12-14 NOTE — Telephone Encounter (Signed)
Patient calls nurse line regarding issues with meloxicam prescription.   Called pharmacy. Per pharmacist, patient's insurance will not cover two tablets daily, they will only cover one.   Pharmacist requesting updated rx for meloxicam 1 tablet daily, if appropriate.   Veronda Prude, RN

## 2020-12-14 NOTE — Telephone Encounter (Signed)
Rx for Meloxicam 15 mg at bedtime sent in as pharmacy requested

## 2020-12-14 NOTE — Assessment & Plan Note (Addendum)
COVID-19 Vaccine Information can be found at: [email or web address] For questions related to vaccine distribution or appointments, please email [email or web address] or call [phone].  Depression screen [facility] 2/9 12/13/2020 09/27/2020 07/26/2020 06/28/2020 05/28/2017  Decreased Interest 2 3 1 1  0  Down, Depressed, Hopeless 2 3 1 1  0  PHQ - 2 Score 4 6 2 2  0  Altered sleeping 2 3 2 1  -  Tired, decreased energy 2 3 1 1  -  Change in appetite 3 3 1  0 -  Feeling bad or failure about yourself  2 3 3 3  -  Trouble concentrating 2 3 3 3  -  Moving slowly or fidgety/restless 0 0 0 0 -  Suicidal thoughts 1 1 1  - -  PHQ-9 Score 16 22 13 10  -  Difficult doing work/chores Very difficult Very difficult - Extremely dIfficult -    Paula Drake's PHQ9 overall score indicates some improvement with start of Prozac by her psychiatrist.   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  If she is able to perform her work without the stress causing worsening of her major depression episode, we can consider recommending further advancement in daily hours.   We defer management of her psychotropic meds to psychiatry.  Patient has restarted her Prozac 20 mg, now with a 90 day supply.  She had appointment to foloow up with her psychiatrist in next 3 months.   I fill follow up with Paula Drake in a month to follow up the effect of work resumption on her mood disorder. 

## 2020-12-14 NOTE — Assessment & Plan Note (Addendum)
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  Her underlining headache syndrome may have been either primary headache tension-type or migraine.  I favor tension-type as she has not had the autonomic symptoms characteristic of migraine.  [facility] _ Start a daily long-acting NSAID: Meloxicam 30 mg at bedtime - continue for one month. Will taper down dose over time. - Start prophylactic: I would prefer to have used a TCA given possibility of underlying TTH, but patient is rightly concerned about adding more psychotropic medications to her current regiment.  Plan use of propranolol XL 80 mg at bedtime.  - Stop BC Powders, may use as abortive for headaches she is unable to stand.  The short-term therapeutic goal is at least to reduce her [facility] Powder use down to just one a day. - Follow up in one month.  Will keep open the option of referral to Neuro or Headache Clinic if not meeting short-term goals   Addendum Patient's insurance will not pay for the 30 mg dosage of Meloxicam as prescribed above.  We changed the prescription to 15 mg at bedtime #30 RF zero

## 2020-12-18 ENCOUNTER — Telehealth: Payer: Self-pay | Admitting: Family Medicine

## 2020-12-18 NOTE — Telephone Encounter (Signed)
Clinical info completed on Disability form.  Place form in PCP's box for completion.  Deral Schellenberg, CMA  

## 2020-12-18 NOTE — Telephone Encounter (Signed)
Disability form dropped off for at front desk for completion.  Verified that patient section of form has been completed.  Last DOS/WCC with PCP was 12/13/2020  Placed form in team folder to be completed by clinical staff.  Paula Drake

## 2020-12-19 NOTE — Telephone Encounter (Signed)
LTD Transition form for [employer] completed Copy fax'd to [place] number on cover sheet. Form placed in RN Triage box.

## 2020-12-20 NOTE — Telephone Encounter (Signed)
Patient called and informed that forms are ready for pick up. Copy made and placed in batch scanning. Original placed at front desk for pick up.   Nell Schrack C Cecillia Menees, RN  

## 2020-12-24 ENCOUNTER — Telehealth: Payer: 59

## 2020-12-24 ENCOUNTER — Telehealth: Payer: Self-pay | Admitting: Licensed Clinical Social Worker

## 2020-12-24 NOTE — Chronic Care Management (AMB) (Signed)
    Clinical Social Work  Care Management   Phone Outreach    12/24/2020 Name: Paula Drake MRN: [id number] DOB: 1991/04/05  Paula Drake is a 30 y.o. year old female who is a primary care patient of McDiarmid, Leighton Roach, MD .   Reason for referral: Mental Health Counseling and Resources.    F/U phone call today to assess needs, progress and barriers with care plan goals.   Telephone outreach was unsuccessful A HIPPA compliant phone message was left for the patient providing contact information and requesting a return call.   Plan:CCM LCSW will wait for return call. If no return call is received, Will route chart to Care Guide to see if patient would like to reschedule phone appointment   Review of patient status, including review of consultants reports, relevant laboratory and other test results, and collaboration with appropriate care team members and the patient's provider was performed as part of comprehensive patient evaluation and provision of care management services.    Sammuel Hines, LCSW Care Management & Coordination  [facility]   [phone] 10:37 AM

## 2020-12-28 ENCOUNTER — Other Ambulatory Visit: Payer: Self-pay | Admitting: Family Medicine

## 2020-12-28 DIAGNOSIS — R519 Headache, unspecified: Secondary | ICD-10-CM

## 2020-12-28 NOTE — Progress Notes (Signed)
Referral sent for neurology consult for patient's chronic daily headaches.

## 2021-01-07 ENCOUNTER — Ambulatory Visit: Payer: 59 | Admitting: Licensed Clinical Social Worker

## 2021-01-07 ENCOUNTER — Telehealth: Payer: Self-pay | Admitting: Licensed Clinical Social Worker

## 2021-01-07 DIAGNOSIS — Z7189 Other specified counseling: Secondary | ICD-10-CM

## 2021-01-07 NOTE — Patient Instructions (Signed)
Visit Information   Goals Addressed             This Visit's Progress    Stick with Counseling   On track    Timeframe:  Short-Term Goal Priority:  High Start Date:  07/05/20                           Expected End Date:                   Patient Goals/Self-Care Activities:  I am so glad you are starting to feel better Keep appointments with mental health providers.  Why is this important?   Beating depression may take some time.  If you don't feel better right away, don't give up on your treatment plan.         It was a pleasure speaking with you today. Please call the office if needed Patient verbalizes understanding of instructions provided today.  Per our conversation I will remain part of your care team for the next 90 days.  If no needs are identified in the next 90 days, I will disconnect from the care team.  Sammuel Hines, LCSW Care Management & Coordination  [phone]

## 2021-01-07 NOTE — Chronic Care Management (AMB) (Signed)
Care Management Clinical Social Work Note  01/07/2021 Name: Paula Drake MRN: [id number] DOB: 04-03-1991  Paula Drake is a 30 y.o. year old female who is a primary care patient of McDiarmid, Leighton Roach, MD.  The Care Management team was consulted for assistance with chronic disease management and coordination needs.  Consent to Services:  The patient was given information about Care Management services, agreed to services, and gave verbal consent prior to initiation of services.  Please see initial visit note for detailed documentation.   Patient agreed to services today and consent obtained.   Assessment: Engaged with Patient by phone in response to provider referral for social work care coordination services: Mental Health Counseling and Resources.   She is making progress with managing symptoms of depression, talks with therapist bi weekly and continues with medication for depression. Patient continues to experience headaches and will f/u with referral placed by PCP for Neurology  . See Care Plan below for interventions and patient self-care actives.  Recent life changes or stressors: returned to work but is out again as she was unable to handle the stress Recommendation: Patient may benefit from, and is in agreement to continue working with her mental health providers.   Follow up Plan:  Patient does not require or desire continued follow-up by LCSW. Will contact the office if needed Patient may benefit from and is in agreement for CCM LCSW to remain part of care team for the next 90 days.  If no needs are identified in the next 90 days, CCM LCSW will disconnect from the care team.    : Review of patient past medical history, allergies, medications, and health status, including review of relevant consultants reports was performed today as part of a comprehensive evaluation and provision of chronic care management and care coordination services.  SDOH (Social Determinants of Health)  assessments and interventions performed:    Advanced Directives Status:   Care Plan  No Known Allergies  Outpatient Encounter Medications as of 01/07/2021  Medication Sig   buPROPion (WELLBUTRIN SR) 150 MG 12 hr tablet Take 1 tablet (150 mg total) by mouth daily.   FLUoxetine (PROZAC) 20 MG tablet Take 20 mg by mouth daily.   ibuprofen (ADVIL,MOTRIN) 200 MG tablet Take 400 mg by mouth every 6 (six) hours as needed for headache.   meloxicam (MOBIC) 15 MG tablet Take 1 tablet (15 mg total) by mouth daily.   propranolol ER (INDERAL LA) 80 MG 24 hr capsule Take 1 capsule (80 mg total) by mouth at bedtime.   traZODone (DESYREL) 50 MG tablet Take 1 tablet (50 mg total) by mouth at bedtime as needed for sleep.   No facility-administered encounter medications on file as of 01/07/2021.    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 Needs Support, and Care Coordination in order to meet unmet need.  Clinical Goal(s) patient will continue to reduce and manage symptoms of depression, insomnia, and stress via ongoing counseling and medication managment.  Clinical Interventions:  Assessed patient's needs, discussed progress and barriers to care with  connecting with new counselor and medication management : Active listening / Reflection utilized , Emotional Supportive Provided,  Inter-disciplinary care team collaboration (see longitudinal plan of care) Patient Goals/Self-Care Activities: Over the next 30 days Keep appointments with mental health providers.        Sammuel Hines, LCSW Care Management & Coordination  [facility] / [facility]   [phone] 11:44 AM

## 2021-01-07 NOTE — Chronic Care Management (AMB) (Signed)
    Clinical Social Work  Care Management   Phone Outreach    01/07/2021 Name: Paula Drake MRN: [id number] DOB: 07/12/1990  Paula Drake is a 30 y.o. year old female who is a primary care patient of McDiarmid, Leighton Roach, MD .   Reason for referral: Mental Health Counseling and Resources.    F/U phone call today to assess needs, progress and barriers with care plan goals.   Telephone outreach was unsuccessful. A HIPPA compliant phone message was left for the patient providing contact information and requesting a return call.  2nd unsuccessful telephone outreach attempt.  If unable to reach patient by phone on the 3rd attempt, will discontinue outreach calls but will be available at any time to provide services.   Plan: CCM LCSW will wait for return call. If no return call is received,  Will route chart to Care Guide to see if patient would like to reschedule phone appointment   Review of patient status, including review of consultants reports, relevant laboratory and other test results, and collaboration with appropriate care team members and the patient's provider was performed as part of comprehensive patient evaluation and provision of care management services.     Sammuel Hines, LCSW Care Management & Coordination  [facility]   [phone] 10:44 AM

## 2021-02-11 ENCOUNTER — Encounter: Payer: Self-pay | Admitting: Family Medicine

## 2021-03-07 ENCOUNTER — Other Ambulatory Visit: Payer: Self-pay

## 2021-03-07 ENCOUNTER — Ambulatory Visit (INDEPENDENT_AMBULATORY_CARE_PROVIDER_SITE_OTHER): Payer: 59 | Admitting: Family Medicine

## 2021-03-07 ENCOUNTER — Encounter: Payer: Self-pay | Admitting: Family Medicine

## 2021-03-07 VITALS — BP 107/79 | HR 75 | Ht 62.0 in | Wt 359.4 lb

## 2021-03-07 DIAGNOSIS — Z021 Encounter for pre-employment examination: Secondary | ICD-10-CM

## 2021-03-07 DIAGNOSIS — Z23 Encounter for immunization: Secondary | ICD-10-CM | POA: Diagnosis not present

## 2021-03-07 DIAGNOSIS — Z111 Encounter for screening for respiratory tuberculosis: Secondary | ICD-10-CM

## 2021-03-07 DIAGNOSIS — Z1159 Encounter for screening for other viral diseases: Secondary | ICD-10-CM

## 2021-03-07 DIAGNOSIS — F321 Major depressive disorder, single episode, moderate: Secondary | ICD-10-CM | POA: Diagnosis not present

## 2021-03-07 NOTE — Patient Instructions (Signed)
You are going through a lot of stressors.    You have a good plan to deal with them.    Our social worker, Ms Christell Constant, will contact you to help with you arranging a new counselor.   Let's stay on your current medications: Prozac (fluoxetine and buropion).   Dr Kelsha Older would like to see you back in 4 weeks to see how things are going.  We are checking you for tuberculosis and hepatitis C virus infections today with blood tests.  Once the results come back, Dr Sunny Gains will be able to complete your employment form.

## 2021-03-08 ENCOUNTER — Encounter: Payer: Self-pay | Admitting: Family Medicine

## 2021-03-08 NOTE — Progress Notes (Addendum)
Paula Drake is alone Sources of clinical information for visit is/are patient. Nursing assessment for this office visit was reviewed with the patient for accuracy and revision.     Previous Report(s) Reviewed: none  Depression screen PHQ 2/9 03/07/2021  Decreased Interest -  Down, Depressed, Hopeless -  PHQ - 2 Score -  Altered sleeping -  Tired, decreased energy -  Change in appetite 1  Feeling bad or failure about yourself  1  Trouble concentrating 1  Moving slowly or fidgety/restless 0  Suicidal thoughts 0  PHQ-9 Score -  Difficult doing work/chores -   Flowsheet Row Office Visit from 03/07/2021 in [facility] Office Visit from 12/13/2020 in [facility] Office Visit from 09/27/2020 in [facility]  Thoughts that you would be better off dead, or of hurting yourself in some way Not at all Several days Several days  PHQ-9 Total Score -- 16 22       Fall Risk  01/19/2017 10/30/2016 05/16/2016 09/11/2015 10/11/2014  Falls in the past year? No No No No No    PHQ9 SCORE ONLY 03/07/2021 12/13/2020 09/27/2020  PHQ-9 Total Score 3 16 22     Adult vaccines due  Topic Date Due   TETANUS/TDAP  12/13/2021 (Originally 11/03/2017)    There are no preventive care reminders to display for this patient.    History/P.E. limitations: none  Adult vaccines due  Topic Date Due   TETANUS/TDAP  12/13/2021 (Originally 11/03/2017)   There are no preventive care reminders to display for this patient. There are no preventive care reminders to display for this patient.   Chief Complaint  Patient presents with   Depression    THIS OFFICE VISIT NOTE WAS FAXED TO [facility] FAX [phone]  [place]

## 2021-03-08 NOTE — Assessment & Plan Note (Addendum)
Depression screen [facility] 2/9 03/07/2021 12/13/2020 09/27/2020  Decreased Interest - 2 3  Down, Depressed, Hopeless - 2 3  PHQ - 2 Score - 4 6  Altered sleeping - 2 3  Tired, decreased energy - 2 3  Change in appetite 1 3 3   Feeling bad or failure about yourself  1 2 3   Trouble concentrating 1 2 3   Moving slowly or fidgety/restless 0 0 0  Suicidal thoughts 0 1 1  PHQ-9 Score - 16 22  Difficult doing work/chores - patient did not complete the PHQ9 Very difficult Very difficult   Improved, partial response Patient still thinks of death, but denies intention or plan of harming herself.   Paula Drake is frustrated that her sadness has not resolved.  She is experiencing multiple psychosocial problems: death of father, risk of losing there home, sheriff serving notice of Credit Card debt hearing, and job dissatisfaction and discord  Paula Drake continues to live with her mother.  She continues to remain withdrawn in her home.  She is having difficulty fulfilling her work duties which she relates to being 'rusty" with customer service tasks.   Paula Drake is no longer engaged with a counselor, feeling the last one was disinterested in her problems.   She continues taking her prescribed bupropion XL 150 mg daily and fluoxetine 20 mg daily.  She no longer takes trazodone.  She still consults with her psychiatrist.  Physical Gen: tearful, mild distress Psych: oriented, fair insight, speech clear and prosodic, soft voice, Language goal-directed and logical, Mood depressed, affect varies between tearful,sad and angry, cooperative, groomed, fair eye contact,   A/ Resistant Major Depression, Moderate, single episode - Partial response P/ Will ask our social worker team to help Paula Drake establish with another counselor.  Continue on current dose /schedule of Fluoxetine/bupropion Continue consultation with her psychiatrist.  RETURN TO CLINIC 4 weeks to assess progress.  - Add adjuvant antidepressant medication,  Bupropion 150 mg XL daily each morning , to current escitalopram 20 mg daily.    - Referral to Psychiatry for evaluation of diagnosis and management of psychotropic therapy.  Paula Drake has private insurance which may give more choices of psychiatrist. - Change in psychotherapist - Paula Drake requests that Paula Jennette Kettle, assist her in the process of navigating the healthcare system to make an initial consultation appointment with a psychitrist and to change her psychotherapist.  - I will see Paula Drake back in 4 weeks.  In my opinion, Paula Drake's partial recovery provides her sufficient ability to maintain professional attitude, demeanors and skills necessary to provide customer service or problem-solving tasks for up to four hours a day for five days a week.

## 2021-03-11 ENCOUNTER — Telehealth: Payer: Self-pay | Admitting: *Deleted

## 2021-03-11 NOTE — Chronic Care Management (AMB) (Signed)
  Care Management   Outreach Note  03/11/2021 Name: Paula Drake MRN: [id number] DOB: 1991/03/25  Referred by: McDiarmid, Leighton Roach, MD Reason for referral : Care Coordination (Initial outreach to schedule referral with Licensed Clinical SW )   An unsuccessful telephone outreach was attempted today. The patient was referred to the case management team for assistance with care management and care coordination.   Follow Up Plan:  A HIPAA compliant phone message was left for the patient providing contact information and requesting a return call.  The care management team will reach out to the patient again over the next 7 days. If patient returns call to provider office, please advise to call Embedded Care Management Care Guide Misty Stanley at [phone].  Gwenevere Ghazi  Care Guide, Embedded Care Coordination [facility]  Direct Dial: [phone]

## 2021-03-13 ENCOUNTER — Telehealth: Payer: Self-pay | Admitting: Family Medicine

## 2021-03-13 NOTE — Telephone Encounter (Signed)
Patient calling, requesting update on her paperwork she left with McDiarmid at her last appointment on November 10th. Patient checking to see when it will be completed.   Patient also wanting to check status of McDiarmid updating her health information with [employer] and sending it to her job.  Please call patient back with an update on this.

## 2021-03-14 ENCOUNTER — Encounter: Payer: Self-pay | Admitting: Family Medicine

## 2021-03-14 NOTE — Telephone Encounter (Signed)
Please let patient know that most recent Form was Faxed to [place] - I just received the form from [employer] about short term disability

## 2021-03-14 NOTE — Telephone Encounter (Signed)
Routed message to PCP. Enzio Buchler, CMA  

## 2021-03-14 NOTE — Telephone Encounter (Signed)
Attempted to reach patient. No answer. LVM of note below. Paula Drake, CMA ? ?

## 2021-03-15 LAB — QUANTIFERON-TB GOLD PLUS
QuantiFERON Mitogen Value: 3.88 IU/mL
QuantiFERON Nil Value: 0.05 IU/mL
QuantiFERON TB1 Ag Value: 0.03 IU/mL
QuantiFERON TB2 Ag Value: 0.03 IU/mL
QuantiFERON-TB Gold Plus: NEGATIVE

## 2021-03-15 LAB — HEPATITIS C ANTIBODY: Hep C Virus Ab: <0.1 s/co ratio (ref 0.0–0.9)

## 2021-03-15 NOTE — Telephone Encounter (Signed)
   Pre-service Health statement and physical form for [employer] completed and includes information of negative quanitferon gold test.

## 2021-03-18 NOTE — Chronic Care Management (AMB) (Signed)
  Chronic Care Management   Note  03/18/2021 Name: Paula Drake MRN: [id number] DOB: [birth year]  Paula Drake is a 30 y.o. year old female who is a primary care patient of McDiarmid, Leighton Roach, MD. Paula Drake is currently enrolled in care management services. An additional referral for SW was placed.   Follow up plan: Telephone appointment with care management team member scheduled for:03/28/21  [facility] Guide, Embedded Care Coordination [facility]  Care Management  Direct Dial: [phone]

## 2021-03-25 ENCOUNTER — Telehealth: Payer: Self-pay | Admitting: Family Medicine

## 2021-03-25 NOTE — Telephone Encounter (Signed)
Patient dropped off form for disability at front desk for completion.  Verified that patient section of form has been completed.  Last DOS with PCP was 03/07/2021.  Placed form in [facility] team folder to be completed by clinical staff.  Edison Pace

## 2021-03-26 NOTE — Telephone Encounter (Signed)
Awaiting office visit eval on 03/28/21 before response to request -

## 2021-03-26 NOTE — Telephone Encounter (Signed)
Clinical info completed on Disability form.  Place form in PCP's box for completion.  Dyer Klug, CMA  

## 2021-03-28 ENCOUNTER — Ambulatory Visit: Payer: 59 | Admitting: Licensed Clinical Social Worker

## 2021-03-28 NOTE — Patient Instructions (Signed)
    I am sorry you were unable to keep your phone appointment today.   per your request your appointment is scheduled 04/01/21 Please call the office if needed  Sammuel Hines, [facility] Care Management & Coordination  [phone]

## 2021-03-28 NOTE — Chronic Care Management (AMB) (Signed)
    Clinical Social Work  Care Management   Phone Outreach    03/28/2021 Name: Paula Drake MRN: [id number] DOB: 1990-07-04  Paula Drake is a 30 y.o. year old female who is a primary care patient of McDiarmid, Leighton Roach, MD .   Reason for referral: Mental Health Counseling and Resources. Requesting assistance to connect with a new mental health provider.  F/U phone call today to assess needs, progress and barriers with care plan goals.   Patient is sleep and unable to keep phone appointment today and requested to reschedule.  Plan:Appointment was rescheduled with CCM LCSW 04/01/2021  Review of patient status, including review of consultants reports, relevant laboratory and other test results, and collaboration with appropriate care team members and the patient's provider was performed as part of comprehensive patient evaluation and provision of care management services.    Sammuel Hines, LCSW Care Management & Coordination  [facility] / [facility] [employer]   [phone]

## 2021-04-01 ENCOUNTER — Ambulatory Visit: Payer: 59 | Admitting: Licensed Clinical Social Worker

## 2021-04-01 DIAGNOSIS — R4589 Other symptoms and signs involving emotional state: Secondary | ICD-10-CM

## 2021-04-01 DIAGNOSIS — Z7689 Persons encountering health services in other specified circumstances: Secondary | ICD-10-CM

## 2021-04-01 NOTE — Progress Notes (Signed)
Message reviewed.

## 2021-04-01 NOTE — Patient Instructions (Signed)
Visit Information  Thank you for taking time to visit with me today. Please don't hesitate to contact me if I can be of assistance to you before our next scheduled telephone appointment.  Following are the goals we discussed today: Reducing symptoms of depression Self-Care Activities:  Keep appointments with mental health providers. Review educational information on Managing Anxiety around daily task and Movement: Emotional Health   Please call the care guide team at [phone] if you need to cancel or reschedule your appointment.   If you are experiencing a Mental Health or Behavioral Health Crisis or need someone to talk to, please call the Suicide and Crisis Lifeline: 988 call the [place] National Suicide Prevention Lifeline: 339 660 8251 or TTY: 718-626-7310 TTY 518-876-0702) to talk to a trained counselor call 1-800-273-TALK (toll free, 24 hour hotline) go to [facility] [address] [place] [phone]) call 911   Patient verbalizes understanding of instructions provided today and agrees to view in MyChart.   Please call the office if needed No follow up scheduled, per our conversation you do not require or desire continued follow up  Sammuel Hines, LCSW Care Management & Coordination  [phone]

## 2021-04-01 NOTE — Chronic Care Management (AMB) (Signed)
Care Management  Clinical Social Work Note  04/01/2021 Name: Paula Drake MRN: [id number] DOB: [birth year]  Paula Drake is a 30 y.o. year old female who is a primary care patient of McDiarmid, Leighton Roach, MD. The CCM team was consulted for assistance with care coordination needs. Mental Health Counseling and Resources   Consent to Services:  The patient was given information about Care Management services, agreed to services, and gave verbal consent prior to initiation of services.  Please see initial visit note for detailed documentation.   Patient agreed to services today and consent obtained.  Engaged with patient by telephone for follow up visit in response to provider referral for social work care coordination services. .   Assessment/Interventions: Patient has decided she does not want to connect with a new therapist at this time.  She is excited about starting a new job in the next month and anticipates this will help with her symptoms.  She will continue with mental health provider for medication management at this time. Discussed interventions to assist with managing symptoms. See Care Plan below for interventions and patient self-care actives.  Review of patient past medical history, allergies, medications, and health status, including review of pertinent consultant reports was performed as part of comprehensive evaluation and provision of care management/care coordination services.     SDOH (Social Determinants of Health) screening and interventions performed today:   Advanced Directives Status:Not addressed in this encounter.     Care Plan    Conditions to be addressed/monitored per PCP order: Depression,   Care Plan : Clinical Social Work  Updates made by Soundra Pilon, LCSW since 04/01/2021 12:00 AM     Problem: Depression      Goal: Decrease and manage depressive symptoms   Start Date: 07/05/2020  This Visit's Progress: On track  Recent Progress: On track  Priority:  High  Note:    Current barriers:    Acute Mental Health needs related to symptoms of depression  Currently unable to independently self manage needs related to mental health conditions. Needs Support, and Care Coordination in order to meet unmet need.  Clinical Goal(s) patient will continue to reduce and manage symptoms of depression, insomnia, and stress via ongoing counseling and medication managment.  Clinical Interventions: has decided she does not want to connect with a new therapist at this time Assessed patient's needs, discussed progress and barriers to care with connecting with new counselor and medication management : Active listening / Reflection utilized , Emotional Supportive Provided, discussed self-care and support for managing emotions EMMI education on Managing Anxiety around daily task and Movement: Emotional Health Inter-disciplinary care team collaboration (see longitudinal plan of care) Patient Goals/Self-Care Activities:  Keep appointments with mental health providers. Review educational information on Managing Anxiety around daily task and Movement: Emotional Health     Follow up Plan:  Patient does not require or desire continued follow-up by CCM LCSW. Will contact the office if needed LCSW will disconnect from care team.  Please consult if new needs arise  Sammuel Hines, LCSW Care Management & Coordination  [facility] / [employer]   [phone]

## 2021-04-03 NOTE — Telephone Encounter (Signed)
Behavioral Health Capacity Questionnaire  form for [employer] completed.  Form placed in RN Triage Box for pick up by patient/family.

## 2021-04-04 NOTE — Telephone Encounter (Signed)
Attempted to call patient and inform that paperwork is ready to be picked up. Patient did not answer and VM is full.   Will send mychart message to notify that paperwork is completed.   Copy made and placed in batch scanning. Will place original at front desk for pick up.   Veronda Prude, RN

## 2021-08-08 ENCOUNTER — Ambulatory Visit (INDEPENDENT_AMBULATORY_CARE_PROVIDER_SITE_OTHER): Payer: 59 | Admitting: Family Medicine

## 2021-08-08 VITALS — BP 128/97 | HR 70 | Ht 62.0 in | Wt 374.0 lb

## 2021-08-08 DIAGNOSIS — M25572 Pain in left ankle and joints of left foot: Secondary | ICD-10-CM

## 2021-08-08 DIAGNOSIS — R519 Headache, unspecified: Secondary | ICD-10-CM

## 2021-08-08 MED ORDER — MELOXICAM 15 MG PO TABS: 15.0000 mg | ORAL_TABLET | Freq: Every day | ORAL | 0 refills | Status: DC

## 2021-08-08 NOTE — Progress Notes (Signed)
? ? ?  SUBJECTIVE:  ? ?CHIEF COMPLAINT / HPI:  ? ?Ankle swelling, knee pain ?Patient presents with 5 to 6-day history of left ankle and knee swelling and pain.  She reports twisting her left ankle on Saturday, twisted laterally, fell and landed on her knees.  She reports continued left ankle swelling ever since and difficulty walking.  Of note, she was able to bear weight immediately after the accident and in clinic today. ? ?For bilateral knees, she reports they pop when she stands up and they hurt walking, especially when going up stairs.  Denies any swelling of the knees.  She reports that they feel "out of place". ? ?Denies knee swelling, warmth, or giving out. ? ?PERTINENT  PMH / PSH:  ?Patient Active Problem List  ? Diagnosis Date Noted  ? Acute left ankle pain 08/11/2021  ? Chronic daily headache 12/14/2020  ? Subcutaneous nodule 07/27/2020  ? Depression, major, single episode, moderate (HCC) 06/29/2020  ? Adjustment insomnia 06/29/2020  ? OBESITY, MORBID 11/04/2007  ? ELEVATED BLOOD PRESSURE 11/04/2007  ?  ?OBJECTIVE:  ? ?BP (!) 128/97   Pulse 70   Ht 5\' 2"  (1.575 m)   Wt (!) 374 lb (169.6 kg)   SpO2 99%   BMI 68.41 kg/m?   ?PHQ-9:  ? ?  08/08/2021  ?  3:31 PM 03/07/2021  ?  9:58 AM 12/13/2020  ?  8:38 AM  ?Depression screen PHQ 2/9  ?Decreased Interest 2  2  ?Down, Depressed, Hopeless 1  2  ?PHQ - 2 Score 3  4  ?Altered sleeping 2  2  ?Tired, decreased energy 0  2  ?Change in appetite 2 1 3   ?Feeling bad or failure about yourself  2 1 2   ?Trouble concentrating 0 1 2  ?Moving slowly or fidgety/restless 0 0 0  ?Suicidal thoughts 0 0 1  ?PHQ-9 Score 9  16  ?Difficult doing work/chores Somewhat difficult  Very difficult  ?   ?Physical Exam ?General: Awake, alert, oriented, no acute distress ?Respiratory: Unlabored respirations, speaking in full sentences, no respiratory distress ?Extremities: Moving all extremities spontaneously, right ankle TTP over lateral and medial malleolus, left knee TTP over lateral  joint line, right knee TTP over medial joint line, bilateral knees with full ROM and negative anterior/posterior drawer test ?Neuro: Cranial nerves II through X grossly intact ?Psych: Normal insight and judgement ? ?ASSESSMENT/PLAN:  ? ?Acute left ankle pain ?Acute.  Likely ankle sprain, given rolled ankle laterally and able to walk on immediately afterwards.  Discussed with patient.  However, given tenderness over lateral and medial malleolus, will obtain left ankle x-ray with mortise view to rule out fracture.  Conservative measures discussed, including RICE.  Meloxicam daily for swelling along with 3 times daily icing of joints affected.  See AVS for more. ?  ? ? ?4/9, MD ?[facility]  ?

## 2021-08-08 NOTE — Patient Instructions (Signed)
It was wonderful to see you today. Thank you for allowing me to be a part of your care. Below is a short summary of what we discussed at your visit today: ? ?Ankle and knee pain ?I believe you sprained your ankle and have pain from the swelling and inflammation in your ankle and knees. ? ?In order to rule out a fracture because of your tenderness over the bone, I am sending you to [facility] imaging for an ankle x-ray.  See below for the information on where to go. ? ?Start meloxicam once a day to help with the swelling and inflammation. ? ?Remember to continue icing your knees and your ankle to 3 times a day, along with elevating your ankle as often as you are sitting down.  This will greatly help with the swelling. ? ? ? ?Please bring all of your medications to every appointment! ? ?If you have any questions or concerns, please do not hesitate to contact us via phone or MyChart message.  ? ?Fayette Pho, MD  ?

## 2021-08-11 DIAGNOSIS — M25572 Pain in left ankle and joints of left foot: Secondary | ICD-10-CM | POA: Insufficient documentation

## 2021-08-11 NOTE — Assessment & Plan Note (Addendum)
Acute.  Likely ankle sprain, given rolled ankle laterally and able to walk on immediately afterwards.  Discussed with patient.  However, given tenderness over lateral and medial malleolus, will obtain left ankle x-ray with mortise view to rule out fracture.  Conservative measures discussed, including RICE.  Meloxicam daily for swelling along with 3 times daily icing of joints affected.  See AVS for more. ?

## 2021-10-01 ENCOUNTER — Encounter: Payer: Self-pay | Admitting: *Deleted

## 2022-04-08 ENCOUNTER — Ambulatory Visit (INDEPENDENT_AMBULATORY_CARE_PROVIDER_SITE_OTHER): Payer: Self-pay | Admitting: Student

## 2022-04-08 ENCOUNTER — Encounter: Payer: Self-pay | Admitting: Student

## 2022-04-08 VITALS — BP 118/78 | HR 100 | Temp 98.0°F | Ht 63.0 in | Wt >= 6400 oz

## 2022-04-08 DIAGNOSIS — J069 Acute upper respiratory infection, unspecified: Secondary | ICD-10-CM

## 2022-04-08 MED ORDER — TRIPLE ANTIBIOTIC 3.5-400-5000 EX OINT: 1.0000 | TOPICAL_OINTMENT | Freq: Every day | CUTANEOUS | 0 refills | Status: DC

## 2022-04-08 MED ORDER — IPRATROPIUM BROMIDE 0.03 % NA SOLN: 2.0000 | Freq: Two times a day (BID) | NASAL | 12 refills | Status: DC

## 2022-04-08 MED ORDER — SALINE SPRAY 0.65 % NA SOLN: 2.0000 | NASAL | 0 refills | Status: DC | PRN

## 2022-04-08 NOTE — Patient Instructions (Addendum)
So great to see you today.  Am sorry you are not feeling well. Please do the following as we discussed while we discussed: 1.  Use nasal saline spray, 2 sprays per nostril twice daily. 2.  Use ipratropium nasal spray 2 puffs per nostril, twice daily.  Continue to drink plenty fluids.  Rest at home for the next day. Avoid contact with others and wash your hands frequently.  If you are worsening, or do not improve in the next 5 days please let me know.  Have a wonderful day, Dr. Melissa Noon

## 2022-04-08 NOTE — Assessment & Plan Note (Signed)
Generally well-appearing 31 year old female with URI symptoms of headache, sore throat, congestion for 3 days. No concern at this time for bacterial infection given duration of symptoms, lack of focal findings on lung exam and HEENT exam. Encouraged to continue with plenty of rest of p.o. fluid hydration. Prescribed ipratropium nasal spray to help with nasal congestion. Encouraged nasal saline to help with inflamed nasal mucosa. Continue Tylenol and Motrin as needed for discomfort or fever. Discussed that should she worsen or not improve within the next week, she may send me a message and I can consider antibiotic treatment at that time.

## 2022-04-08 NOTE — Progress Notes (Signed)
    SUBJECTIVE:   CHIEF COMPLAINT / HPI:   Jalesha is a 31 year-old here with headache, sore throat, congestion. She started feeling unwell 3 days ago.  Denies any fever, nausea, vomiting, diarrhea. 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  Denies ever being diagnosed with COVID or the flu.  Has not received COVID vaccinations, did not receive annual flu vaccination  She has been taking NyQuil, DayQuil and Alka-Seltzer without relief.  PERTINENT  PMH / PSH: Reviewed  OBJECTIVE:   BP 118/78   Pulse 100   Temp 98 F (36.7 C) (Oral)   Ht 5\' 3"  (1.6 m)   Wt (!) 400 lb (181.4 kg)   LMP 03/24/2022 (Approximate)   SpO2 100%   BMI 70.86 kg/m   General: Tired, but nontoxic-appearing 31 year old female in no distress HEENT: No conjunctival injection.  Nasal mucosa boggy and erythematous.  Mild oropharyngeal erythema.  Postnasal drip noted. CV: Regular rate and rhythm Respiratory: Normal work of breathing on room air, no wheezing or crackles.  Good air movement throughout although limited secondary to body habitus Skin: Warm and dry  ASSESSMENT/PLAN:   Viral URI Generally well-appearing 31 year old female with URI symptoms of headache, sore throat, congestion for 3 days. No concern at this time for bacterial infection given duration of symptoms, lack of focal findings on lung exam and HEENT exam. Encouraged to continue with plenty of rest of p.o. fluid hydration. Prescribed ipratropium nasal spray to help with nasal congestion. Encouraged nasal saline to help with inflamed nasal mucosa. Continue Tylenol and Motrin as needed for discomfort or fever. Discussed that should she worsen or not improve within the next week, she may send me a message and I can consider antibiotic treatment at that time.     39, DO [facility]

## 2022-04-09 ENCOUNTER — Encounter: Payer: Self-pay | Admitting: Student

## 2022-05-28 ENCOUNTER — Telehealth: Payer: Self-pay | Admitting: Family Medicine

## 2022-05-28 NOTE — Telephone Encounter (Signed)
After hour line  Patient reports experiencing chills after work, she does not have access to a thermometer so is unable to know if she has a fever. She has never had this feeling before. 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 She has not done a home test for COVID. Denies chest pain, cough, congestion, sore throat or rhinorrhea at this time. Reports having myalgias as well. Feeling fatigue and weak since this afternoon. Conveyed to patient that she likely has a viral infection, encouraged to stay hydrated and get rest. Strict return and ED precautions discussed. Instructed to call to schedule an appointment if symptoms worsening in 1-2 days. All questions answered. Patient was appreciative of the call.

## 2022-06-02 ENCOUNTER — Ambulatory Visit (INDEPENDENT_AMBULATORY_CARE_PROVIDER_SITE_OTHER): Payer: Self-pay | Admitting: Family Medicine

## 2022-06-02 VITALS — BP 130/85 | HR 91 | Ht 63.19 in | Wt >= 6400 oz

## 2022-06-02 DIAGNOSIS — F321 Major depressive disorder, single episode, moderate: Secondary | ICD-10-CM

## 2022-06-02 DIAGNOSIS — R109 Unspecified abdominal pain: Secondary | ICD-10-CM

## 2022-06-02 DIAGNOSIS — R197 Diarrhea, unspecified: Secondary | ICD-10-CM

## 2022-06-02 MED ORDER — DICYCLOMINE HCL 10 MG PO CAPS: 10.0000 mg | ORAL_CAPSULE | Freq: Three times a day (TID) | ORAL | 0 refills | Status: DC

## 2022-06-02 MED ORDER — LOPERAMIDE HCL 2 MG PO TABS: 2.0000 mg | ORAL_TABLET | Freq: Four times a day (QID) | ORAL | 0 refills | Status: DC | PRN

## 2022-06-02 NOTE — Progress Notes (Signed)
SUBJECTIVE:   CHIEF COMPLAINT / HPI:   Paula Drake is a 32 y.o. female who presents to the [facility] clinic today to discuss the following concerns:   Abdominal Pain, Diarrhea Symptoms started on Thursday with chills. She contemplated going to the ED but decided against it. She called the after hours line and was recommended to schedule an appointment if her symptoms persist.   She states that she no longer has chills but she continues to have loose watery stools. She estimates about 9-10 episodes each day. She has central abdominal pain that feels like "a know". One time she saw some bright red blood when she wiped. She is feeling sore from wiping so much. She reports a subjective fever the first night.   She has tried Pepto-bismol which slightly helped her pain (went from 10/10 to 8/10). She denies any nausea or vomiting. She states that her entire household has similar symptoms now. 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   No recent antibiotics. No recent travel.   Mood "I feel a mess". She feels depressed. She states that she stopped her Wellbutrin and Prozac because she thought she was feeling better. She didn't care much for the medications, though. She feels they gave her a headache more than anything. She has been off of them for months. She did mark question #9 positive on PHQ-9 but when asked about it she reports it was an error. She denies any thoughts of suicide or homicidal ideations. She is not interested in therapy. 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   PERTINENT  PMH / PSH: MDD  OBJECTIVE:   BP 130/85   Pulse 91   Ht 5' 3.19" (1.605 m)   Wt (!) 403 lb (182.8 kg)   SpO2 100%   BMI 70.96 kg/m    General: NAD, pleasant, able to participate in exam Respiratory:  normal effort Abdomen: Bowel sounds present, generalized ttp in all quadrants except for RLQ, non-distended, no R/G, obese abdomen, soft  Skin: warm and dry, normal skin  turgor  Psych: Normal affect and mood     06/02/2022   10:31 AM 04/08/2022    9:55 AM 08/08/2021    3:31 PM  Depression screen PHQ 2/9  Decreased Interest 1 2 2   Down, Depressed, Hopeless 1  1  PHQ - 2 Score 2 2 3   Altered sleeping 1 2 2   Tired, decreased energy 1 2 0  Change in appetite 2 2 2   Feeling bad or failure about yourself  1 2 2   Trouble concentrating 2 1 0  Moving slowly or fidgety/restless 2 0 0  Suicidal thoughts 3 0 0  PHQ-9 Score 14 11 9   Difficult doing work/chores   Somewhat difficult   ASSESSMENT/PLAN:   1. Abdominal spasms With associated diarrhea. Will send in medication to help with discomfort. - dicyclomine (BENTYL) 10 MG capsule; Take 1 capsule (10 mg total) by mouth 4 (four) times daily -  before meals and at bedtime.  Dispense: 30 capsule; Refill: 0  2. Diarrhea of presumed infectious origin No risk factors for bacterial etiology. Presumed likely viral etiology so will defer stool testing at this time as unlikely to change management. Adequately hydrated at this time. - Recommend frequent handwashing with soap and water to prevent spread -Encourage hydration - loperamide (IMODIUM A-D) 2 MG tablet; Take 1 tablet (2 mg total) by mouth 4 (four) times daily as needed for diarrhea  or loose stools.  Dispense: 30 tablet; Refill: 0  3. Depression, major, single episode, moderate (HCC) Elevated PHQ-9 score today, though she marked Q#9 positively on questionnaire, she denied this when asked. She reports it was an error. She is not interested in going back on medications at this time. She was hesitant about therapy. She feels her depression stems from not having her life be at a place she envisioned for herself at this time. Turning 32 recently has made her reflect.  -Therapy/counseling resources provided in AVS -Suicide resources provided in AVS -Encouraged f/u for mood, consideration of other SSRI if she is amenable      Sharion Settler, [place]

## 2022-06-02 NOTE — Patient Instructions (Addendum)
It was wonderful to see you today.  Please bring ALL of your medications with you to every visit.   Today we talked about:  I think that we can hold off on stool testing at this time.  He do not have any risk factors that would suggest bacterial gastroenteritis.  I am sending a prescription for loperamide which she can use up to 4 times daily.  I have also sent prescription for Bentyl which should help with your abdominal cramps/ discomfort.   Continue to stay hydrated! This is most important. Wash hands with soap and water to prevent spread. Please return if your symptoms do not improve after 2 weeks or if you feel they are worsening despite medications.  Thank you for coming to your visit as scheduled. We have had a large "no-show" problem lately, and this significantly limits our ability to see and care for patients. As a friendly reminder- if you cannot make your appointment please call to cancel. We do have a no show policy for those who do not cancel within 24 hours. Our policy is that if you miss or fail to cancel an appointment within 24 hours, 3 times in a 62-month period, you may be dismissed from our clinic.   Thank you for choosing [facility].   Please call [phone] with any questions about today's appointment.  Please be sure to schedule follow up at the front  desk before you leave today.   Sharion Settler, DO PGY-3 Family Medicine    Therapy and Counseling Resources Most providers on this list will take Medicaid. Patients with commercial insurance or Medicare should contact their insurance company to get a list of in network providers.  [employer] (takes children) Location 1: [address], [place], [place] [address] Location 2: [place], [place] [address] [place] ([place] speaking therapist available)(habla espanol)(take medicare and medicaid)  [place], [place], [place] [address],  [place] [email or web address] [phone]  [facility]:Psychiatry and Counseling [address] [place], [place] [address] [place], [place], [place] [address]      [phone]  [place] (spanish available) [place], [place] [address] [place] (take [facility] and medicare) [address] [place], [place] [address]       [phone]     [place] (virtual only) [phone]  [facility] [address] [place], [place]  [facility]:  [place]. [place] [phone]  [facility]:  [place] [address] [phone]  [facility] (under & uninsured) [address], [place] [phone]    [email or web address]    [place] [address]  [place]    [phone]  [facility] [place]     Phone:  [phone]     [facility] [place]  [place] [address]      [place], [place] ext [place]: [place], [place], [place]   [place] ([place] therapist) [email or web address]  [place] [address]   [place] [address]    [phone]    [facility]   [address],  [place], [place]   [place]  [place]  [facility]  [address] [place], [place]        [phone]  Open Access/Walk In Clinic under & uninsured  [facility]  [address] [place], [place] Crisis [phone]  [facility] of the [place],  ([place])   [place], [place]  [place]: [phone]) 8:30 - 12; 1 - 2:30  Family Service of the [place],  [place], [place]    [phone]):8:30 - 12; 2 - 3PM  [facility] [employer],  [address]  [place]; [phone]):   Mon - Fri 8 AM - 5 PM  [facility] [place]  MWF 12:30 to 3:00 or call to schedule an appointment  [phone]  Specific Provider options Psychology Today  [email or web address] click on find a therapist  enter your zip code left side and select or tailor a therapist for your specific need.   [facility] Provider Directory [email or web address]  (Medicaid)   Follow all drop down to find a provider  [place] or [email or web address] [address], [place], [place] Recovery support and educational   24- Hour Availability:   [facility]  [address] [place], [place] Crisis [phone]  Family Service of the [employer] [phone]  [place]  [phone]   [place]  [phone] (after hours)  [facility]/Mobile Crisis   [phone]  [place] National Suicide Hotline  [phone] Diamantina Monks)  Call 911 or go to emergency room  [facility]  [phone]);  [place] and [employer]  [phone]); [place], [place], [place], [place], [place], [place], [place]  If you are feeling suicidal or depression symptoms worsen please immediately go to:   If you are thinking about harming yourself or having thoughts of suicide, or if you know someone who is, seek help right away. If you are in crisis, make sure you are not left alone.  If someone else is in crisis, make sure he/she/they is not left alone  Call 988 OR 1-800-273-TALK  24 Hour Availability for [place]  [address] [place], [place] Crisis [phone]    Other crisis resources:  Family Service of the [employer] (Domestic Violence, Rape & Victim Assistance [phone]  [facility] [place]    (ONLY from 8am-4pm)    [phone]  [facility] Mobile Crisis Unit (24/7)   [phone]  [place] National Suicide Hotline   [phone] Diamantina Monks)

## 2022-06-06 ENCOUNTER — Telehealth: Payer: Self-pay | Admitting: Family Medicine

## 2022-06-06 NOTE — Telephone Encounter (Signed)
Received message from front staff noting that patient was requesting an extended work note to have her return to work on 2/12. She was last seen by me on 2/5 for diarrhea thought to be caused by viral gastroenteritis.  She was prescribed loperamide at that time.  She was given a work note to return to work on 2/8.   Called patient, she reports feeling improved but notes that she did not want to return to work until fully recovered. Discussed with patient that if she had concerns regarding not being able to return to work it would have been recommended to return for re-evaluation. Unfortunately I am unable to back date and excuse her missed work from yesterday and today without re-evaluation.

## 2022-10-16 ENCOUNTER — Encounter: Payer: Self-pay | Admitting: Student

## 2022-10-16 ENCOUNTER — Ambulatory Visit (INDEPENDENT_AMBULATORY_CARE_PROVIDER_SITE_OTHER): Payer: 59 | Admitting: Student

## 2022-10-16 VITALS — HR 90 | Ht 63.0 in | Wt >= 6400 oz

## 2022-10-16 DIAGNOSIS — F321 Major depressive disorder, single episode, moderate: Secondary | ICD-10-CM | POA: Diagnosis not present

## 2022-10-16 DIAGNOSIS — Z0000 Encounter for general adult medical examination without abnormal findings: Secondary | ICD-10-CM | POA: Diagnosis not present

## 2022-10-16 LAB — POCT GLYCOSYLATED HEMOGLOBIN (HGB A1C): Hemoglobin A1C: 5.2 % (ref 4.0–5.6)

## 2022-10-16 MED ORDER — FLUOXETINE HCL 20 MG PO TABS
20.0000 mg | ORAL_TABLET | Freq: Every day | ORAL | 0 refills | Status: DC
Start: 2022-10-16 — End: 2022-12-11

## 2022-10-16 NOTE — Progress Notes (Signed)
Annual Wellness Visit   Patient: Paula Drake, Female    DOB: March 10, 1991, 32 y.o.   MRN: [id number]  Subjective  Chief Complaint  Patient presents with   Annual Exam    Physical.  Increased urine - requesting blood work for A1C  Worsening anxiety / depression Puffy / swollen hands in the a.m.     Paula Drake is a 32 y.o. female who presents today for her Annual Wellness Visit.   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 Was in therapy and stopped because she felt the therapist wasn't as engaging. Stopped taking Depression med because it didn't work and was giving her headache took it for 1 month. Patient still endorsed having headache weeks after stopping Wellbutrin. Doesn't like taking medication, prefers supplement and natural alternatives. Acknowledges knowing nothing about depression medication Like Sammuel Hines because she follows up with her No SI/HI Patient feels unhealthy because of her weight. Endorses major stressors in her life include finances, work, fear of death because she feels unhappy with the due to being overweight.  Medications: Not on any medication. Does DC powder about 3 times a week for headache. Nothing else works.  Outpatient Medications Prior to Visit  Medication Sig   [DISCONTINUED] FLUoxetine (PROZAC) 20 MG tablet Take 20 mg by mouth daily.   propranolol ER (INDERAL LA) 80 MG 24 hr capsule Take 1 capsule (80 mg total) by mouth at bedtime. (Patient not taking: Reported on 06/02/2022)   sodium chloride (OCEAN) 0.65 % SOLN nasal spray Place 2 sprays into both  nostrils as needed for congestion. (Patient not taking: Reported on 10/16/2022)   [DISCONTINUED] buPROPion (WELLBUTRIN SR) 150 MG 12 hr tablet Take 1 tablet (150 mg total) by mouth daily. (Patient not taking: Reported on 06/02/2022)   [DISCONTINUED] dicyclomine (BENTYL) 10 MG capsule Take 1 capsule (10 mg total) by mouth 4 (four) times daily -  before meals and at bedtime. (Patient not taking: Reported on 10/16/2022)   [DISCONTINUED] ibuprofen (ADVIL,MOTRIN) 200 MG tablet Take 400 mg by mouth every 6 (six) hours as needed for headache. (Patient not taking: Reported on 10/16/2022)   [DISCONTINUED] ipratropium (ATROVENT) 0.03 % nasal spray Place 2 sprays into both nostrils every 12 (twelve) hours. (Patient not taking: Reported on 06/02/2022)   [DISCONTINUED] loperamide (IMODIUM A-D) 2 MG tablet Take 1 tablet (2 mg total) by mouth 4 (four) times daily as needed for diarrhea or loose stools. (Patient not taking: Reported on 10/16/2022)   [DISCONTINUED] meloxicam (MOBIC) 15 MG tablet Take 1 tablet (15 mg total) by mouth daily. (Patient not taking: Reported on 06/02/2022)   [DISCONTINUED] neomycin-bacitracin-polymyxin [phone] OINT Apply 1 Application topically daily. (Patient not taking: Reported on 06/02/2022)   No facility-administered medications prior to visit.    No Known Allergies  Patient Care Team: McDiarmid, Leighton Roach, MD as PCP - General   Objective  Pulse 90   Ht 5\' 3"  (1.6 m)   Wt (!) 411 lb (186.4 kg)   SpO2 95%   BMI 72.81 kg/m   Physical Exam General: Alert, Morbidly Obese, NAD HEENT: Atraumatic,  MMM, No sclera icterus CV: RRR, no murmurs, normal S1/S2 Pulm: CTAB, good WOB on RA, no crackles or wheezing Abd: Soft, no distension, no tenderness Skin: dry, warm Ext: No BLE edema, +2 Pedal and radial pulse.   Most recent fall risk assessment:    10/16/2022   10:02 AM  Fall Risk   Falls in the past year? 0  Number falls in past yr: 0  Injury with Fall? 0  Risk for fall due to : No  Fall Risks    Most recent depression screenings:    06/02/2022   10:31 AM 04/08/2022    9:55 AM  PHQ 2/9 Scores  PHQ - 2 Score 2 2  PHQ- 9 Score 14 11    Assessment & Plan   Annual wellness visit done today including the all of the following: Reviewed patient's Family Medical History Reviewed and updated list of patient's medical providers Assessment of cognitive impairment was done Assessed patient's functional ability Established a written schedule for health screening services Health Risk Assessent Completed and Reviewed  Exercise Activities and Dietary recommendations  Goals      complete Advance Directive     Patient Goals/Self-Care Activities : Over the next 30 days Review educational information on Advance Directive from [facility] Education video Complete Advance Directive packet,  Have advance directive notarized and provide a copy to provider office Call if you have questions         Immunization History  Administered Date(s) Administered   Hepatitis A 11/04/2007   Hpv-Unspecified 11/04/2007, 01/06/2008   Influenza,inj,Quad PF,6+ Mos 05/17/2013, 03/07/2021   Meningococcal polysaccharide vaccine (MPSV4) 11/04/2007   Td 11/04/2007   Varicella 11/04/2007    Health Maintenance  Topic Date Due   HPV VACCINES (3 - 3-dose series) 05/06/2008   DTaP/Tdap/Td (2 - Tdap) 11/03/2017   INFLUENZA VACCINE  11/27/2022   PAP SMEAR-Modifier  07/27/2023   Hepatitis C Screening  Completed   HIV Screening  Completed   COVID-19 Vaccine  Discontinued     Discussed health benefits of physical activity, and encouraged her to engage in regular exercise appropriate for her age and condition.    Problem List Items Addressed This Visit       Other   OBESITY, MORBID    Patient's weight today is 411lb and BMI of 72.81.  Her weight has become a major stressor in her life which makes her feel unhealthy and associated with her anxiety of dying soon.  Taking steps in losing weight as  she has recently cut of soda and juice from her diet.  She is currently on regular diet.  A1c obtained today was 5.2%. -Discussed dieting including cutting down on high carbs diet: Rice, bread, pasta. -Obtain A1c to test for diabetes -Discussed medication options -Encourage patient to increase her exercising at least 30-40 minutes 3-4 days a week. -Will follow-up with her PCP to discuss more on weight loss.      Depression, major, single episode, moderate (HCC) - Primary    PHQ 9 score of 14.  No active SI/HI.  Patient acknowledges major stressors in her life attributing to her depression include health, social issues including caring for her mom, work and finances.  Sleeping on Wellbutrin which she reported is ineffective, I suspect noncompliance as patient discontinued medication within a month and due to concerns of headache.  Unsure if her headache is due to Wellbutrin given that she has continued to have the headache even after discontinuing Wellbutrin.  Will discontinue  her Wellbutrin and switch her to Prozac. -Rx Prozac 20 mg daily -Encouraged patient to seek counseling -Follow-up in 3-4 weeks to evaluate medication side effect and update on counseling.      Relevant Medications   FLUoxetine (PROZAC) 20 MG tablet   Other Visit Diagnoses     Encounter for wellness examination       Relevant Orders   HgB A1c (Completed)   Basic Metabolic Panel   TSH   Lipid Panel         Jerre Simon, MD

## 2022-10-16 NOTE — Assessment & Plan Note (Signed)
PHQ 9 score of 14.  No active SI/HI.  Patient acknowledges major stressors in her life attributing to her depression include health, social issues including caring for her mom, work and finances.  Sleeping on Wellbutrin which she reported is ineffective, I suspect noncompliance as patient discontinued medication within a month and due to concerns of headache.  Unsure if her headache is due to Wellbutrin given that she has continued to have the headache even after discontinuing Wellbutrin.  Will discontinue her Wellbutrin and switch her to Prozac. -Rx Prozac 20 mg daily -Encouraged patient to seek counseling -Follow-up in 3-4 weeks to evaluate medication side effect and update on counseling.

## 2022-10-16 NOTE — Patient Instructions (Addendum)
It was wonderful to meet you today. Thank you for allowing me to be a part of your care. Below is a short summary of what we discussed at your visit today:  I have placed lab to check your A1c for diabetes, thyroid level, cholesterol level, electrolyte, kidney function and your blood count.  I suspect your anxiety and depression is mostly attributed to daily life problem and social situation.  Also this is affecting your sleep as well.  I have sent in prescription for Prozac which you will take 20 mg daily and I recommend finding a therapist in the [facility] network.  For your weight I recommend you continue cutting out sugary drinks and high carb foods including rice, white bread and pasta.  I also recommend exercise at least 30 - 40 minutes 3-4 times a week.  Follow-up with me or your PCP in 3-4 weeks to discuss weight loss options  Please bring all of your medications to every appointment!  If you have any questions or concerns, please do not hesitate to contact us via phone or MyChart message.   Jerre Simon, MD [facility]

## 2022-10-16 NOTE — Assessment & Plan Note (Addendum)
Patient's weight today is 411lb and BMI of 72.81.  Her weight has become a major stressor in her life which makes her feel unhealthy and associated with her anxiety of dying soon.  Taking steps in losing weight as she has recently cut of soda and juice from her diet.  She is currently on regular diet.  A1c obtained today was 5.2%. -Discussed dieting including cutting down on high carbs diet: Rice, bread, pasta. -Obtain A1c to test for diabetes -Discussed medication options -Encourage patient to increase her exercising at least 30-40 minutes 3-4 days a week. -Will follow-up with her PCP to discuss more on weight loss.

## 2022-10-17 LAB — BASIC METABOLIC PANEL
BUN/Creatinine Ratio: 17 (ref 9–23)
BUN: 13 mg/dL (ref 6–20)
CO2: 24 mmol/L (ref 20–29)
Calcium: 9.6 mg/dL (ref 8.7–10.2)
Chloride: 104 mmol/L (ref 96–106)
Creatinine, Ser: 0.77 mg/dL (ref 0.57–1.00)
Glucose: 87 mg/dL (ref 70–99)
Potassium: 4.4 mmol/L (ref 3.5–5.2)
Sodium: 141 mmol/L (ref 134–144)
eGFR: 106 mL/min/{1.73_m2} (ref >59)

## 2022-10-17 LAB — TSH: TSH: 1.56 u[IU]/mL (ref 0.450–4.500)

## 2022-10-17 LAB — LIPID PANEL
Chol/HDL Ratio: 3.3 ratio (ref 0.0–4.4)
Cholesterol, Total: 211 mg/dL — ABNORMAL HIGH (ref 100–199)
HDL: 63 mg/dL (ref >39)
LDL Chol Calc (NIH): 131 mg/dL — ABNORMAL HIGH (ref 0–99)
Triglycerides: 97 mg/dL (ref 0–149)
VLDL Cholesterol Cal: 17 mg/dL (ref 5–40)

## 2022-10-28 ENCOUNTER — Encounter: Payer: Self-pay | Admitting: Family Medicine

## 2022-11-19 ENCOUNTER — Ambulatory Visit: Payer: 59 | Admitting: Family Medicine

## 2022-12-10 ENCOUNTER — Encounter: Payer: Self-pay | Admitting: Family Medicine

## 2022-12-10 DIAGNOSIS — F5102 Adjustment insomnia: Secondary | ICD-10-CM

## 2022-12-11 MED ORDER — TRAZODONE HCL 50 MG PO TABS
25.0000 mg | ORAL_TABLET | Freq: Every evening | ORAL | 0 refills | Status: DC | PRN
Start: 2022-12-11 — End: 2023-01-08

## 2022-12-18 ENCOUNTER — Ambulatory Visit: Payer: 59 | Admitting: Family Medicine

## 2022-12-25 ENCOUNTER — Ambulatory Visit: Payer: 59 | Admitting: Family Medicine

## 2022-12-31 ENCOUNTER — Encounter: Payer: Self-pay | Admitting: Family Medicine

## 2022-12-31 ENCOUNTER — Ambulatory Visit (HOSPITAL_COMMUNITY)
Admission: RE | Admit: 2022-12-31 | Discharge: 2022-12-31 | Disposition: A | Discharge status: Home / Self Care | Payer: No Typology Code available for payment source | Source: Ambulatory Visit | Attending: Family Medicine | Admitting: Family Medicine

## 2022-12-31 ENCOUNTER — Ambulatory Visit (INDEPENDENT_AMBULATORY_CARE_PROVIDER_SITE_OTHER): Payer: No Typology Code available for payment source | Admitting: Family Medicine

## 2022-12-31 VITALS — BP 138/84 | HR 92 | Wt >= 6400 oz

## 2022-12-31 DIAGNOSIS — R002 Palpitations: Secondary | ICD-10-CM | POA: Insufficient documentation

## 2022-12-31 DIAGNOSIS — G4489 Other headache syndrome: Secondary | ICD-10-CM

## 2022-12-31 HISTORY — DX: Palpitations: R00.2

## 2022-12-31 MED ORDER — PROPRANOLOL HCL ER 80 MG PO CP24: 80.0000 mg | ORAL_CAPSULE | Freq: Every day | ORAL | 0 refills | Status: DC

## 2022-12-31 NOTE — Assessment & Plan Note (Signed)
Unsure if these are actual episodes of tachycardia that cause emotional symptoms or vice versa.  Very unlikely to be any malignant arrythmia given normal exam and ECG and lack of exertional symptoms.  Recent TSH was normal.  Discussed options.  Decided to restart inderal and follow up with PCP.  If persists may need prolonged cardiac monitoring

## 2022-12-31 NOTE — Progress Notes (Signed)
    SUBJECTIVE:   CHIEF COMPLAINT / HPI:   Heart Racing Feels like over the last 2 weeks has been having episodes of heart racing especially at night or when sitting.  Tried trazodone which made her sleepy but would awaken feeling very anxious and heart racing.  Has not had episodes when working or moving. No shortness of breath or chest pain with exertion. No weight loss or any otc medications Has not take Inderal for months   Anxiety Especially at night.  Prescribed trazodone on 8/15 did not think it helped Tried Prozac in June but that did not help and Wellbutrin in past   PERTINENT  PMH / PSH: Depression, Elevated BP, Inderal, trazodone TSH normal in June   OBJECTIVE:   BP 138/84   Pulse 92   Wt (!) 415 lb (188.2 kg)   LMP 11/27/2022   SpO2 98%   BMI 73.51 kg/m   Heart - tachy RR without mgr Lungs:  Normal respiratory effort, chest expands symmetrically. Lungs are clear to auscultation, no crackles or wheezes. Extremities:  No cyanosis, edema, or deformity noted with good range of motion of all major joints.   Mobility:able to get up and down from exam table without assistance or distress  ECG - NSR no ST changes  ASSESSMENT/PLAN:   Palpitations Assessment & Plan: Unsure if these are actual episodes of tachycardia that cause emotional symptoms or vice versa.  Very unlikely to be any malignant arrythmia given normal exam and ECG and lack of exertional symptoms.  Recent TSH was normal.  Discussed options.  Decided to restart inderal and follow up with PCP.  If persists may need prolonged cardiac monitoring    Other headache syndrome -     Propranolol HCl ER; Take 1 capsule (80 mg total) by mouth at bedtime.  Dispense: 30 capsule; Refill: 0  Other orders -     EKG 12-Lead  Anxiety - Given complex medication history will observeShe will follow up with PCP.  Keep a diary of events.  To consider buspar is inderal does not help.    Patient Instructions  Good to see you  today - Thank you for coming in  Things we discussed today:  Palpitations - take inderal 80 mg at night  - Dont take any other medications - Keep a diary of when or if these episodes happen and how long - If any significant chest pain or shortness of breath when exercising go to the ER  See Dr McDiarmid next week   Carney Living, MD [facility]

## 2022-12-31 NOTE — Patient Instructions (Signed)
Good to see you today - Thank you for coming in  Things we discussed today:  Palpitations - take inderal 80 mg at night  - Dont take any other medications - Keep a diary of when or if these episodes happen and how long - If any significant chest pain or shortness of breath when exercising go to the ER  See Dr McDiarmid next week

## 2023-01-01 ENCOUNTER — Encounter: Payer: Self-pay | Admitting: Family Medicine

## 2023-01-02 ENCOUNTER — Encounter: Payer: Self-pay | Admitting: Family Medicine

## 2023-01-08 ENCOUNTER — Encounter: Payer: Self-pay | Admitting: Family Medicine

## 2023-01-08 ENCOUNTER — Ambulatory Visit (INDEPENDENT_AMBULATORY_CARE_PROVIDER_SITE_OTHER): Payer: No Typology Code available for payment source | Admitting: Family Medicine

## 2023-01-08 VITALS — BP 146/106 | HR 97 | Ht 63.0 in | Wt >= 6400 oz

## 2023-01-08 DIAGNOSIS — R030 Elevated blood-pressure reading, without diagnosis of hypertension: Secondary | ICD-10-CM

## 2023-01-08 DIAGNOSIS — R002 Palpitations: Secondary | ICD-10-CM | POA: Diagnosis not present

## 2023-01-08 DIAGNOSIS — Z23 Encounter for immunization: Secondary | ICD-10-CM | POA: Diagnosis not present

## 2023-01-08 DIAGNOSIS — F410 Panic disorder [episodic paroxysmal anxiety] without agoraphobia: Secondary | ICD-10-CM

## 2023-01-08 DIAGNOSIS — Z6841 Body Mass Index (BMI) 40.0 and over, adult: Secondary | ICD-10-CM

## 2023-01-08 MED ORDER — HYDROXYZINE HCL 10 MG PO TABS
ORAL_TABLET | ORAL | 0 refills | Status: DC
Start: 2023-01-08 — End: 2023-03-12

## 2023-01-08 MED ORDER — SEMAGLUTIDE(0.25 OR 0.5MG/DOS) 2 MG/1.5ML ~~LOC~~ SOPN
0.2500 mg | PEN_INJECTOR | SUBCUTANEOUS | 0 refills | Status: DC
Start: 2023-01-08 — End: 2023-01-20

## 2023-01-08 MED ORDER — SERTRALINE HCL 25 MG PO TABS
25.0000 mg | ORAL_TABLET | Freq: Every day | ORAL | 0 refills | Status: DC
Start: 2023-01-08 — End: 2023-03-12

## 2023-01-08 NOTE — Patient Instructions (Addendum)
For Panic Disorder Start Sertraline 25 mg tablet, take one tablet by mouth daily Hydroxyzine 10 mg tablet, take half tablet during day if needed for anxiety or panic attack.  Take a half to a whole tablet at night if you need help falling asleep.

## 2023-01-09 ENCOUNTER — Other Ambulatory Visit (HOSPITAL_COMMUNITY): Payer: Self-pay

## 2023-01-09 DIAGNOSIS — F410 Panic disorder [episodic paroxysmal anxiety] without agoraphobia: Secondary | ICD-10-CM | POA: Insufficient documentation

## 2023-01-09 HISTORY — DX: Panic disorder (episodic paroxysmal anxiety): F41.0

## 2023-01-09 NOTE — Assessment & Plan Note (Addendum)
Established problem worsened.  Wt Readings from Last 3 Encounters:  01/08/23 (!) 416 lb 9.6 oz (189 kg)  12/31/22 (!) 415 lb (188.2 kg)  10/16/22 (!) 411 lb (186.4 kg)    Patient is not at goal of stable or decreasing weight.  Her BMI is 73.8%.  Paula Drake has twice attempted weight reduction with diets, including caloric restriction and high protein diets, without success.  She walks for exercise more than three times a week, again without success.  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   I believe Paula Drake's functional ability and emotional state would benefit from weight reduction assistance from a GLP-1 agonist or a GLP-1 agonist with GIP agonist in addition to her current caloric restriction diet and walking exercise program.   Patient without history of pancreatitis and without Family history of thyroid cancer or MENS II.   Start: semaglutide 0.25 mg q week for 4 weeks with follow up to evaluate tolerance.

## 2023-01-09 NOTE — Assessment & Plan Note (Addendum)
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   Felt Inderal form 9/4 visit made her heart rate too slow and made her dizzy. She stopped taking it.  Prozac from Dr Elliot Gurney made her feel bad so stopped it.  Bupropion gave her a headache.   Difficulty with sleep remains a major difficulty and is interfering with her ability to work.  Trazodone was not effective.   She does not have a counselor/psychologist.   A/  Possible panic disorder P/ Gradual titration of sertraline , start 25 mg daily Prescription hydroxyzine 10 mg at bedtime, may take half-tablets twice during day for anxiety or painic attacks.  Will continue while titrating sertraline up Discussed benefit from re-engaging with counselor/psychologist, especially as she begins in her new, demanding profession.  RTC 2 wks

## 2023-01-09 NOTE — Assessment & Plan Note (Signed)
Today's Vitals   01/08/23 0959 01/08/23 1111  BP: (!) 145/99 (!) 146/106  Pulse: 97   SpO2: 100%   Weight: (!) 416 lb 9.6 oz (189 kg)   Height: 5\' 3"  (1.6 m)   PainSc: 0-No pain    Body mass index is 73.8 kg/m.  Will address on next office visit.

## 2023-01-09 NOTE — Progress Notes (Signed)
Paula Drake is alone Sources of clinical information for visit is/are patient. Nursing assessment for this office visit was reviewed with the patient for accuracy and revision.     Previous Report(s) Reviewed: none     01/08/2023   10:00 AM  Depression screen PHQ 2/9  Decreased Interest 1  Down, Depressed, Hopeless 1  PHQ - 2 Score 2  Altered sleeping 2  Tired, decreased energy 1  Change in appetite 1  Feeling bad or failure about yourself  0  Trouble concentrating 1  Moving slowly or fidgety/restless 0  Suicidal thoughts 0  PHQ-9 Score 7  Difficult doing work/chores Very difficult   Flowsheet Row Office Visit from 01/08/2023 in [facility] Office Visit from 06/02/2022 in [facility] Office Visit from 04/08/2022 in [facility]  Thoughts that you would be better off dead, or of hurting yourself in some way Not at all Nearly every day Not at all  PHQ-9 Total Score 7 14 11           10/16/2022   10:02 AM 01/19/2017   10:21 AM 10/30/2016   11:22 AM 05/16/2016   11:41 AM 09/11/2015    4:07 PM  Fall Risk   Falls in the past year? 0 No No No No  Number falls in past yr: 0      Injury with Fall? 0      Risk for fall due to : No Fall Risks           01/08/2023   10:00 AM 06/02/2022   10:31 AM 04/08/2022    9:55 AM  PHQ9 SCORE ONLY  PHQ-9 Total Score 7 14 11     There are no preventive care reminders to display for this patient.  There are no preventive care reminders to display for this patient.    History/P.E. limitations: none  There are no preventive care reminders to display for this patient. There are no preventive care reminders to display for this patient. There are no preventive care reminders to display for this patient.   Chief Complaint  Patient presents with   Depression   Anxiety f/u      --------------------------------------------------------------------------------------------------------------------------------------------- Visit Problem List with A/P  No problem-specific Assessment & Plan notes found for this encounter.

## 2023-01-12 ENCOUNTER — Telehealth: Payer: Self-pay

## 2023-01-12 NOTE — Telephone Encounter (Signed)
Rec'd PA request for patients ozempic.   Unable to do PA request for medication without dx of diabetes.

## 2023-02-17 ENCOUNTER — Ambulatory Visit (INDEPENDENT_AMBULATORY_CARE_PROVIDER_SITE_OTHER): Payer: No Typology Code available for payment source | Admitting: Family Medicine

## 2023-02-17 ENCOUNTER — Encounter: Payer: Self-pay | Admitting: Family Medicine

## 2023-02-17 VITALS — BP 170/103 | HR 104 | Ht 63.0 in | Wt >= 6400 oz

## 2023-02-17 DIAGNOSIS — B9789 Other viral agents as the cause of diseases classified elsewhere: Secondary | ICD-10-CM | POA: Diagnosis not present

## 2023-02-17 DIAGNOSIS — J988 Other specified respiratory disorders: Secondary | ICD-10-CM | POA: Diagnosis not present

## 2023-02-17 MED ORDER — BENZONATATE 100 MG PO CAPS: 100.0000 mg | ORAL_CAPSULE | Freq: Two times a day (BID) | ORAL | 0 refills | Status: DC | PRN

## 2023-02-17 NOTE — Progress Notes (Signed)
    SUBJECTIVE:   CHIEF COMPLAINT / HPI:   URI  Major symptoms: nasal congestion, achy, sore throat  Progression: started 3 days ago is about the same Medications tried: OTC evening cold medications  Sick contacts: works in day care Patient believes may be caused by virus  Symptoms Fever: no Headache or face pain: no Tooth pain: no Sneezing: mild Scratchy throat: yes when nose is clogged Allergies: no Muscle aches: yes Severe fatigue: no Stiff neck: no Shortness of breath: no Rash: no Sore throat or swollen glands: no  PERTINENT  PMH / PSH: Anxiety, Elevated BP. Obesity  OBJECTIVE:   BP (!) 170/103   Pulse (!) 104   Ht 5\' 3"  (1.6 m)   Wt (!) 413 lb 9.6 oz (187.6 kg)   SpO2 99%   BMI 73.27 kg/m   Alert no distress Heart - Regular rate and rhythm.  No murmurs, gallops or rubs.    Lungs:  Normal respiratory effort, chest expands symmetrically. Lungs are clear to auscultation, no crackles or wheezes. Throat: normal mucosa, no exudate, uvula midline, no redness Nose - bilateral congestion clear rhinorrhea Neck:  No deformities, thyromegaly, masses, or tenderness noted.   Supple with full range of motion without pain. Skin:  Intact without suspicious lesions or rashes   ASSESSMENT/PLAN:   There are no diagnoses linked to this encounter.  URI No signs of sinusitis or pneumonia. Consistent with viral URI.  Treat symptomatically  Elevated BP Has follow up appointment Recommend trying to get home blood pressure machine   Patient Instructions  Good to see you today - Thank you for coming in  Things we discussed today:  Cold - Use Afrin nasal spray twice a day for 3 Days Only - continue the Nasal saline four times a day - Tessalon perles twice a day as needed for cough - Tylenol for four times a day for aches If not better in one week or worsening let us know  Blood pressure Follow up with Dr McDiarmid  Check with your insurance about a blood pressure  cuff If get it then bring in with your next visit    Carney Living, MD [facility]

## 2023-02-17 NOTE — Patient Instructions (Signed)
Good to see you today - Thank you for coming in  Things we discussed today:  Cold - Use Afrin nasal spray twice a day for 3 Days Only - continue the Nasal saline four times a day - Tessalon perles twice a day as needed for cough - Tylenol for four times a day for aches If not better in one week or worsening let us know  Blood pressure Follow up with Dr McDiarmid  Check with your insurance about a blood pressure cuff If get it then bring in with your next visit

## 2023-02-18 ENCOUNTER — Encounter: Payer: Self-pay | Admitting: Family Medicine

## 2023-02-18 DIAGNOSIS — R051 Acute cough: Secondary | ICD-10-CM

## 2023-02-18 DIAGNOSIS — J22 Unspecified acute lower respiratory infection: Secondary | ICD-10-CM

## 2023-02-19 MED ORDER — GUAIFENESIN-CODEINE 100-10 MG/5ML PO SOLN: 5.0000 mL | Freq: Three times a day (TID) | ORAL | 0 refills | Status: DC | PRN

## 2023-03-09 MED ORDER — AZITHROMYCIN 250 MG PO TABS: ORAL_TABLET | ORAL | 0 refills | Status: DC

## 2023-03-09 NOTE — Addendum Note (Signed)
Addended by: Acquanetta Belling D on: 03/09/2023 08:31 AM   Modules accepted: Orders

## 2023-03-12 ENCOUNTER — Encounter: Payer: Self-pay | Admitting: Family Medicine

## 2023-03-12 ENCOUNTER — Ambulatory Visit (INDEPENDENT_AMBULATORY_CARE_PROVIDER_SITE_OTHER): Payer: No Typology Code available for payment source | Admitting: Family Medicine

## 2023-03-12 VITALS — BP 137/88 | HR 115 | Ht 63.0 in | Wt >= 6400 oz

## 2023-03-12 DIAGNOSIS — R052 Subacute cough: Secondary | ICD-10-CM | POA: Diagnosis not present

## 2023-03-12 DIAGNOSIS — F321 Major depressive disorder, single episode, moderate: Secondary | ICD-10-CM | POA: Diagnosis not present

## 2023-03-12 DIAGNOSIS — R030 Elevated blood-pressure reading, without diagnosis of hypertension: Secondary | ICD-10-CM

## 2023-03-12 DIAGNOSIS — F410 Panic disorder [episodic paroxysmal anxiety] without agoraphobia: Secondary | ICD-10-CM

## 2023-03-12 MED ORDER — HYDROXYZINE HCL 10 MG PO TABS: ORAL_TABLET | ORAL | 5 refills | Status: DC

## 2023-03-12 MED ORDER — SERTRALINE HCL 50 MG PO TABS: 50.0000 mg | ORAL_TABLET | Freq: Every day | ORAL | 3 refills | Status: DC

## 2023-03-12 NOTE — Progress Notes (Signed)
Paula Drake is alone Sources of clinical information for visit is/are patient. Nursing assessment for this office visit was reviewed with the patient for accuracy and revision.     Previous Report(s) Reviewed: none     03/12/2023   10:27 AM  Depression screen PHQ 2/9  Decreased Interest 1  Down, Depressed, Hopeless 1  PHQ - 2 Score 2  Altered sleeping 0  Tired, decreased energy 1  Change in appetite 1  Feeling bad or failure about yourself  0  Trouble concentrating 3  Moving slowly or fidgety/restless 0  Suicidal thoughts 0  PHQ-9 Score 7   Flowsheet Row Office Visit from 03/12/2023 in [facility] - A Dept Of [place]. [facility] Office Visit from 01/08/2023 in [facility] - A Dept Of Eligha Bridegroom. [employer] Office Visit from 06/02/2022 in [facility] - A Dept Of Eligha Bridegroom. [facility]  Thoughts that you would be better off dead, or of hurting yourself in some way Not at all Not at all Nearly every day  PHQ-9 Total Score 7 7 14           10/16/2022   10:02 AM 01/19/2017   10:21 AM 10/30/2016   11:22 AM 05/16/2016   11:41 AM 09/11/2015    4:07 PM  Fall Risk   Falls in the past year? 0 No No No No  Number falls in past yr: 0      Injury with Fall? 0      Risk for fall due to : No Fall Risks           03/12/2023   10:27 AM 01/08/2023   10:00 AM 06/02/2022   10:31 AM  PHQ9 SCORE ONLY  PHQ-9 Total Score 7 7 14     There are no preventive care reminders to display for this patient.  There are no preventive care reminders to display for this patient.    History/P.E. limitations: none  There are no preventive care reminders to display for this patient. There are no preventive care reminders to display for this patient. There are no preventive care reminders to display for this patient.   Chief Complaint  Patient presents with   Follow-up    BP      --------------------------------------------------------------------------------------------------------------------------------------------- Visit Problem List with A/P  Obesity, Class III, BMI 40-49.9 (morbid obesity) (HCC) Established problem that has improved with 6 pounds of weight loss over 2 months thru diet and exercise. Unfortunately, her insurance would not cover GLP-1  [name] is interested in her nutrition.  [facility] may be a good option should she wish to pursue it more.   Depression, major, single episode, moderate (HCC) Established problem that has improved    03/12/2023   10:27 AM 01/08/2023   10:00 AM 06/02/2022   10:31 AM 04/08/2022    9:55 AM 08/08/2021    3:31 PM  Depression screen PHQ 2/9  Decreased Interest 1 1 1 2 2   Down, Depressed, Hopeless 1 1 1  1   PHQ - 2 Score 2 2 2 2 3   Altered sleeping 0 2 1 2 2   Tired, decreased energy 1 1 1 2  0  Change in appetite 1 1 2 2 2   Feeling bad or failure about yourself  0 0 1 2 2   Trouble concentrating 3 1 2 1  0  Moving slowly or fidgety/restless 0 0 2 0 0  Suicidal  thoughts 0 0 3 0 0  PHQ-9 Score 7 7 14 11 9   Difficult doing work/chores  Very difficult   Somewhat difficult   Stable.   [name] is uncertain whether the sertraline is helping.  She is willling to try a higher dosage. [name] is functioning well in the community and in her professional role.  Increase sertraline to 50 mg daily Refilled hydroxyzine which Ms Bott takes as needed, only taking a half a tablet at a time as a whole makes her too sleepy.    ELEVATED BLOOD PRESSURE [name]'s BP is again elevated.  She does not want to start a medication for now.  She wants to work on her nutrition, increase her potassium and magnesium intake with salt substitues and mineral supplements.  She has Nurse, learning disability. Will send in home BP monitor prescription to her pharmacy to see if the insurer will cover cost.   Subacute  cough Persistent problem Diagnosis at [facility] about a month ago with URI. Had persistence of cough that was preventing her from working.  Called in course Robitussin AC which did not help. Began Zpak three days ago prescribed by Timtohy Broski over the phone.   Cough persists. She is now working.  Patient notes drainage down back of throat that is worse at night.   She has been using Flonase without improvement, but using it only intermittently  No cough during office visit.   A/ Subacute cough: Likely combination of postinfectious cough and post-nasal drip.  P/ Scheduled daily Flonase for next two weeks. If drainage persists, add Astelin NS If still persists, then CXR  Panic disorder, possible Established problem Improved, but not resolved. Increase sertraline to 50 mg daily and continue prn hydroxyzine.

## 2023-03-12 NOTE — Patient Instructions (Signed)
Using Salt Substitute will help lower Blood Pressure.   Potassium and Magnesium helf lower blood pressure.  Take as directed on the bottles.  Buy the least expensive of these minerals.  They all work the [employer].     Use the Flonase daily for two weeks to see if it will decrease the drainage in the back of your throat.    We increased your Sertraline to 50 mg tablet daily.  I you decide to stop the Sertraline, let Dr Rayaan Lorah know so we can figure out a way to safely stop it.

## 2023-03-13 ENCOUNTER — Encounter: Payer: Self-pay | Admitting: Family Medicine

## 2023-03-13 DIAGNOSIS — R052 Subacute cough: Secondary | ICD-10-CM | POA: Insufficient documentation

## 2023-03-13 MED ORDER — BLOOD PRESSURE KIT DEVI: 1.0000 | Freq: Every day | 0 refills | Status: DC

## 2023-03-13 NOTE — Assessment & Plan Note (Signed)
Established problem that has improved with 6 pounds of weight loss over 2 months thru diet and exercise. Unfortunately, her insurance would not cover GLP-1  Ms Vozza is interested in her nutrition.  [facility] may be a good option should she wish to pursue it more.

## 2023-03-13 NOTE — Assessment & Plan Note (Signed)
Ms Coaxum's BP is again elevated.  She does not want to start a medication for now.  She wants to work on her nutrition, increase her potassium and magnesium intake with salt substitues and mineral supplements.  She has Nurse, learning disability. Will send in home BP monitor prescription to her pharmacy to see if the insurer will cover cost.

## 2023-03-13 NOTE — Assessment & Plan Note (Signed)
Persistent problem Diagnosis at [facility] about a month ago with URI. Had persistence of cough that was preventing her from working.  Called in course Robitussin AC which did not help. Began Zpak three days ago prescribed by Shimeka Bacot over the phone.   Cough persists. She is now working.  Patient notes drainage down back of throat that is worse at night.   She has been using Flonase without improvement, but using it only intermittently  No cough during office visit.   A/ Subacute cough: Likely combination of postinfectious cough and post-nasal drip.  P/ Scheduled daily Flonase for next two weeks. If drainage persists, add Astelin NS If still persists, then CXR

## 2023-03-13 NOTE — Assessment & Plan Note (Signed)
Established problem that has improved    03/12/2023   10:27 AM 01/08/2023   10:00 AM 06/02/2022   10:31 AM 04/08/2022    9:55 AM 08/08/2021    3:31 PM  Depression screen PHQ 2/9  Decreased Interest 1 1 1 2 2   Down, Depressed, Hopeless 1 1 1  1   PHQ - 2 Score 2 2 2 2 3   Altered sleeping 0 2 1 2 2   Tired, decreased energy 1 1 1 2  0  Change in appetite 1 1 2 2 2   Feeling bad or failure about yourself  0 0 1 2 2   Trouble concentrating 3 1 2 1  0  Moving slowly or fidgety/restless 0 0 2 0 0  Suicidal thoughts 0 0 3 0 0  PHQ-9 Score 7 7 14 11 9   Difficult doing work/chores  Very difficult   Somewhat difficult   Stable.   Paula Drake is uncertain whether the sertraline is helping.  She is willling to try a higher dosage. Paula Drake is functioning well in the community and in her professional role.  Increase sertraline to 50 mg daily Refilled hydroxyzine which Paula Drake takes as needed, only taking a half a tablet at a time as a whole makes her too sleepy.

## 2023-03-13 NOTE — Assessment & Plan Note (Signed)
Established problem Improved, but not resolved. Increase sertraline to 50 mg daily and continue prn hydroxyzine.

## 2023-03-31 ENCOUNTER — Telehealth: Payer: Self-pay

## 2023-03-31 DIAGNOSIS — R052 Subacute cough: Secondary | ICD-10-CM

## 2023-03-31 NOTE — Telephone Encounter (Signed)
Patient calls nurse line requesting a CXR.   She reports she is still having a persistent cough. She reports the cough is dry. She denies any fevers.  . She denies any new or worsening symptoms.   She reports she was told to call back in a few weeks if cough persists.   Will forward to PCP for advisement.

## 2023-04-01 MED ORDER — OMEPRAZOLE 40 MG PO CPDR: 40.0000 mg | DELAYED_RELEASE_CAPSULE | Freq: Two times a day (BID) | ORAL | 0 refills | Status: DC

## 2023-04-01 NOTE — Telephone Encounter (Signed)
Trial omeprazole 40 mg twice a day for possible GERD cough equivalent.   Please ask Paula Drake to let me know if the cough continues after taking the omeprazole for a month.

## 2023-04-03 NOTE — Telephone Encounter (Signed)
Attempted to reach patient. No answer. LVM of note left by provider. Aquilla Solian, CMA

## 2023-04-05 ENCOUNTER — Encounter: Payer: Self-pay | Admitting: Family Medicine

## 2023-04-05 DIAGNOSIS — R053 Chronic cough: Secondary | ICD-10-CM

## 2023-04-30 ENCOUNTER — Ambulatory Visit
Admission: RE | Admit: 2023-04-30 | Discharge: 2023-04-30 | Disposition: A | Discharge status: Home / Self Care | Payer: 59 | Source: Ambulatory Visit | Attending: Family Medicine | Admitting: Family Medicine

## 2023-04-30 DIAGNOSIS — R053 Chronic cough: Secondary | ICD-10-CM

## 2023-06-10 ENCOUNTER — Encounter: Payer: Self-pay | Admitting: Family Medicine

## 2023-07-20 ENCOUNTER — Encounter: Payer: Self-pay | Admitting: Family Medicine

## 2023-07-20 ENCOUNTER — Ambulatory Visit: Admitting: Family Medicine

## 2023-07-20 VITALS — BP 140/90 | HR 103 | Ht 62.0 in | Wt >= 6400 oz

## 2023-07-20 DIAGNOSIS — I10 Essential (primary) hypertension: Secondary | ICD-10-CM

## 2023-07-20 DIAGNOSIS — B360 Pityriasis versicolor: Secondary | ICD-10-CM

## 2023-07-20 DIAGNOSIS — L83 Acanthosis nigricans: Secondary | ICD-10-CM

## 2023-07-20 LAB — POCT GLYCOSYLATED HEMOGLOBIN (HGB A1C): Hemoglobin A1C: 5.5 % (ref 4.0–5.6)

## 2023-07-20 MED ORDER — KETOCONAZOLE 2 % EX CREA: 1.0000 | TOPICAL_CREAM | Freq: Every day | CUTANEOUS | 1 refills | Status: DC

## 2023-07-20 MED ORDER — AMLODIPINE BESYLATE 5 MG PO TABS: 5.0000 mg | ORAL_TABLET | Freq: Every day | ORAL | 3 refills | Status: DC

## 2023-07-20 NOTE — Progress Notes (Signed)
    SUBJECTIVE:   CHIEF COMPLAINT / HPI:   Rashes on the back of her neck- burning, not itching. Started as a circle she thought it was ringworm, put over the counter fungal medicine on it and it went away, then came back and has been spreading. No itching, no other spots, no history of eczema.   Hypertension- present previously, has not wanted to start medication. Is willing to start today. Feels like she has been stressed by new job. Has not tried other medications before.   OBJECTIVE:   BP (!) 140/90   Pulse (!) 103   Ht 5\' 2"  (1.575 m)   Wt (!) 422 lb (191.4 kg)   SpO2 100%   BMI 77.18 kg/m   General: alert & oriented, no apparent distress, well groomed HEENT: normocephalic, atraumatic, EOM grossly intact, oral mucosa moist, neck supple Respiratory: normal respiratory effort GI: non-distended Skin: erythematous patches with raised edges in right and left neck fold, with some hypopigmentation, no vesicles or other lesions, no jaundice Psych: appropriate mood and affect   ASSESSMENT/PLAN:   Assessment & Plan Tinea versicolor Noted in neck folds, ketoconazole cream daily Essential hypertension Start amlodipine 5mg  daily, BMP today, follow up 1 month to repeat BP Acanthosis nigricans Check A1c today, normal in June 2024     Billey Co, MD [facility]

## 2023-07-20 NOTE — Patient Instructions (Signed)
 It was wonderful to see you today.  Please bring ALL of your medications with you to every visit.   Today we talked about:  I think your neck is a fungal rash- please use the ketoconazole cream once daily until resolved.  We started amlodipine 5mg  daily for your blood pressure. Please make a follow up in 1 month to check on this.  Thank you for choosing [facility].   Please call [phone] with any questions about today's appointment.  Please arrive at least 15 minutes prior to your scheduled appointments.   If you had blood work today, I will send you a MyChart message or a letter if results are normal. Otherwise, I will give you a call.   If you had a referral placed, they will call you to set up an appointment. Please give [place] a call if you don't hear back in the next 2 weeks.   If you need additional refills before your next appointment, please call your pharmacy first.   Burley Saver, MD  Family Medicine

## 2023-07-21 ENCOUNTER — Encounter: Payer: Self-pay | Admitting: Family Medicine

## 2023-07-21 LAB — BASIC METABOLIC PANEL
BUN/Creatinine Ratio: 15 (ref 9–23)
BUN: 12 mg/dL (ref 6–20)
CO2: 22 mmol/L (ref 20–29)
Calcium: 9.2 mg/dL (ref 8.7–10.2)
Chloride: 102 mmol/L (ref 96–106)
Creatinine, Ser: 0.79 mg/dL (ref 0.57–1.00)
Glucose: 81 mg/dL (ref 70–99)
Potassium: 4.8 mmol/L (ref 3.5–5.2)
Sodium: 139 mmol/L (ref 134–144)
eGFR: 102 mL/min/{1.73_m2} (ref >59)

## 2023-07-23 ENCOUNTER — Ambulatory Visit (INDEPENDENT_AMBULATORY_CARE_PROVIDER_SITE_OTHER): Admitting: Family Medicine

## 2023-07-23 ENCOUNTER — Ambulatory Visit: Attending: Family Medicine

## 2023-07-23 VITALS — BP 137/100 | HR 86 | Ht 62.0 in | Wt >= 6400 oz

## 2023-07-23 DIAGNOSIS — R002 Palpitations: Secondary | ICD-10-CM

## 2023-07-23 DIAGNOSIS — I10 Essential (primary) hypertension: Secondary | ICD-10-CM

## 2023-07-23 MED ORDER — SEMAGLUTIDE(0.25 OR 0.5MG/DOS) 2 MG/1.5ML ~~LOC~~ SOPN: PEN_INJECTOR | SUBCUTANEOUS | 3 refills | Status: DC

## 2023-07-23 NOTE — Progress Notes (Unsigned)
 Enrolled for [employer] to mail a ZIO XT long term holter monitor to the patients address on file.

## 2023-07-23 NOTE — Progress Notes (Signed)
    SUBJECTIVE:   CHIEF COMPLAINT / HPI:   Heart racing Ongoing for the past few months, reports about 2 episodes per week.  The episodes can come on suddenly and even awaken her from her sleep.  Its uncomfortable palpitations but denies chest pain or shortness of breath.  Reports she did start taking amlodipine 1 day ago and was worried it may be related to symptoms.  Underlying anxiety, acknowledges may be playing a role but would like to rule out cardiac etiology.  Hypertension: - Medications: Amlodipine 5 mg daily - Compliance: Yes, only took 1 day. - Checking BP at home: No, ordered a cuff that should be arriving today - Denies any SOB, CP, vision changes, LE edema, medication SEs, or symptoms of hypotension   PERTINENT  PMH / PSH: Morbid obesity, anxiety, depression  OBJECTIVE:   BP (!) 137/100   Pulse 86   Ht 5\' 2"  (1.575 m)   Wt (!) 420 lb 6.4 oz (190.7 kg)   SpO2 100%   BMI 76.89 kg/m    General: NAD, pleasant, able to participate in exam Cardiac: RRR, no murmurs. Respiratory: CTAB, normal effort, No wheezes, rales or rhonchi Extremities: no edema or cyanosis. Skin: warm and dry, no rashes noted Neuro: alert, no obvious focal deficits Psych: Normal affect and mood  ASSESSMENT/PLAN:   Assessment & Plan Palpitations Ongoing, symptoms impacting ADLs.  Underlying anxiety possibly playing a role but prudent to rule out cardiac etiology.  EKG unfortunately unavailable in clinic today.  Will order home monitoring with Zio patch follow-up result. -Zio patch x 14 days -TSH rule out secondary cause Morbid obesity (HCC) BMI 77, desires weight loss but unfortunately has failed multiple trials of dietary changes and exercise for at least the past 6 months without sustained weight loss.  Good candidate for GLP-1 therapy. -Start semaglutide 0.25 mg weekly x 4 weeks, then increase to 0.5 mg weekly Essential hypertension 137/100 upon repeat, unlikely palpitations result of  amlodipine given chronicity.  Recommend restarting medication given persistently elevated pressures and patient agreeable. -Restart amlodipine 5 mg daily -Recommend home BP log with goal of less than 130/80   Dr. Elberta Fortis, DO [facility] Center

## 2023-07-23 NOTE — Patient Instructions (Signed)
 It was wonderful to see you today! Thank you for choosing [facility].   Please bring ALL of your medications with you to every visit.   Today we talked about:  For your palpitations since they have been going on for some time I ordered the Zio patch which will be sent to your home.  As we discussed you will wear for 2 weeks and then send it back.  Will get the results and follow-up if there are any abnormal heart rhythms.  I am also checking her thyroid today to make sure that is not related.  It is possible this is related to anxiety but we can rule out other possible causes as well. Your blood pressure is elevated today but I think restarting the amlodipine will help.  As we discussed it is unlikely the amlodipine caused her palpitations given the long half-life but if you have issues again with it please let us know.  I recommend checking her blood pressure at home with a goal of less than 130/80.  Please see the instructions below for blood pressure readings. I resent the Ozempic for weight management.  As we discussed this may not be covered by insurance but since you have failed multiple diet and exercise options hopefully will be approved.  It is a once weekly injection that you will started on 0.25 mg dose for 4 weeks and then increase to the 0.5 mg dosing.  May cause some nausea and stomach upset initially, please adjust to portion sizes and focus on well-balanced meals with lean protein and fruits and vegetables.  Please follow up in 2 weeks with Dr. Lum Babe to discuss menstrual cycle and follow up on blood pressure   We are checking some labs today. If they are abnormal, I will call you. If they are normal, I will send you a MyChart message (if it is active) or a letter in the mail. If you do not hear about your labs in the next 2 weeks, please call the office.  Call the clinic at [phone] if your symptoms worsen or you have any concerns.  Please be sure to schedule  follow up at the front desk before you leave today.   Elberta Fortis, DO Family Medicine    Blood Pressure Record Sheet To take your blood pressure, you will need a blood pressure machine. You can buy a blood pressure machine (blood pressure monitor) at your clinic, drug store, or online. When choosing one, consider: An automatic monitor that has an arm cuff. A cuff that wraps snugly around your upper arm. You should be able to fit only one finger between your arm and the cuff. A device that stores blood pressure reading results. Do not choose a monitor that measures your blood pressure from your wrist or finger. Follow your health care provider's instructions for how to take your blood pressure. To use this form: Take your blood pressure medications every day These measurements should be taken when you have been at rest for at least 10-15 min Take at least 2 readings with each blood pressure check. This makes sure the results are correct. Wait 1-2 minutes between measurements. Write down the results in the spaces on this form. Keep in mind it should always be recorded systolic over diastolic. Both numbers are important.  Repeat this every day for 2-3 weeks, or as told by your health care provider.  Make a follow-up appointment with your health care provider to discuss the results.  Blood Pressure Log Date Medications taken? (Y/N) Blood Pressure Time of Day

## 2023-07-23 NOTE — Assessment & Plan Note (Signed)
 Ongoing, symptoms impacting ADLs.  Underlying anxiety possibly playing a role but prudent to rule out cardiac etiology.  EKG unfortunately unavailable in clinic today.  Will order home monitoring with Zio patch follow-up result. -Zio patch x 14 days -TSH rule out secondary cause

## 2023-07-24 ENCOUNTER — Telehealth: Payer: Self-pay

## 2023-07-24 NOTE — Telephone Encounter (Signed)
 Rec'd PA for Ozempic medication.   Medication used for dx of diabetes.   Try Wegovy or Zepbound for dx of weight loss.

## 2023-07-25 LAB — TSH: TSH: 1.91 u[IU]/mL (ref 0.450–4.500)

## 2023-07-26 ENCOUNTER — Encounter: Payer: Self-pay | Admitting: Family Medicine

## 2023-07-26 MED ORDER — SEMAGLUTIDE-WEIGHT MANAGEMENT 0.25 MG/0.5ML ~~LOC~~ SOAJ: 0.2500 mg | SUBCUTANEOUS | 0 refills | Status: AC

## 2023-07-26 MED ORDER — SEMAGLUTIDE-WEIGHT MANAGEMENT 0.5 MG/0.5ML ~~LOC~~ SOAJ
0.5000 mg | SUBCUTANEOUS | 2 refills | Status: DC
Start: 2023-08-24 — End: 2023-10-08

## 2023-07-27 ENCOUNTER — Encounter: Payer: Self-pay | Admitting: Family Medicine

## 2023-07-27 ENCOUNTER — Telehealth: Payer: Self-pay

## 2023-07-27 NOTE — Telephone Encounter (Signed)
 Pharmacy Patient Advocate Encounter   Received notification from CoverMyMeds that prior authorization for [facility] 0.25MG  is required/requested.   Insurance verification completed.   The patient is insured through [facility] .   PA required; PA submitted to above mentioned insurance via CoverMyMeds Key/confirmation #/EOC [id number]. Status is pending

## 2023-07-28 NOTE — Telephone Encounter (Signed)
 Pharmacy Patient Advocate Encounter  Received notification from [facility] that Prior Authorization for [facility] has been DENIED.  Full denial letter will be uploaded to the media tab. See denial reason below.    PA #/Case ID/Reference #: [id number]

## 2023-07-30 ENCOUNTER — Encounter: Payer: Self-pay | Admitting: Family Medicine

## 2023-08-04 ENCOUNTER — Ambulatory Visit: Admitting: Family Medicine

## 2023-08-18 ENCOUNTER — Encounter: Payer: Self-pay | Admitting: Family Medicine

## 2023-08-20 DIAGNOSIS — R002 Palpitations: Secondary | ICD-10-CM | POA: Diagnosis not present

## 2023-08-20 NOTE — Telephone Encounter (Signed)
 Patient calls nurse line reporting her Zio Patch results are back.   She reports she read the report to the best of her ability and would like to discuss with Ival Marines as soon as possible. She reports anxiety surrounding what she "read."  Advised will forward to [place].  She asks you call or mychart message her.

## 2023-08-21 ENCOUNTER — Encounter: Payer: Self-pay | Admitting: Family Medicine

## 2023-08-28 ENCOUNTER — Ambulatory Visit: Admitting: Family Medicine

## 2023-08-28 ENCOUNTER — Encounter: Payer: Self-pay | Admitting: Family Medicine

## 2023-08-28 VITALS — BP 134/104 | HR 88 | Ht 62.0 in | Wt >= 6400 oz

## 2023-08-28 DIAGNOSIS — M25572 Pain in left ankle and joints of left foot: Secondary | ICD-10-CM

## 2023-08-28 DIAGNOSIS — N926 Irregular menstruation, unspecified: Secondary | ICD-10-CM

## 2023-08-28 DIAGNOSIS — N912 Amenorrhea, unspecified: Secondary | ICD-10-CM | POA: Insufficient documentation

## 2023-08-28 DIAGNOSIS — I10 Essential (primary) hypertension: Secondary | ICD-10-CM | POA: Diagnosis not present

## 2023-08-28 DIAGNOSIS — N911 Secondary amenorrhea: Secondary | ICD-10-CM | POA: Insufficient documentation

## 2023-08-28 NOTE — Assessment & Plan Note (Addendum)
??   PCOS/Anovulation Upreg declined as she has not been sexually active in over 4 years Hormonal lab checked Pelvic US  ordered and was scheduled I will contact her with results and management plan following test results

## 2023-08-28 NOTE — Patient Instructions (Addendum)
 It was nice seeing you today. You diastolic BP is elevated. Let us  go up on your Amlodipine  from 5 mg to 10 mg. Your BP goal should be between 120/70 - 140/90. If your BP is less than 110/70, please take only one tablet of your 5 mg Amlodipine . Follow up in 2 weeks for reassessment.

## 2023-08-28 NOTE — Assessment & Plan Note (Addendum)
 Likely due to overuse ?? Some underlying arthritis Symptoms improved since yesterday Consider an xray if this persists or reoccurs Monitor closely for now

## 2023-08-28 NOTE — Progress Notes (Signed)
 SUBJECTIVE:   CHIEF COMPLAINT / HPI:   Hypertension Chronicity: BP elevation for the last few months. She is compliant with Norvasc  5 mg QD. The problem has been waxing and waning since onset. The problem is uncontrolled. Associated symptoms include anxiety. Pertinent negatives include no blurred vision, chest pain, headaches, palpitations or shortness of breath. (Home BP readings are normal in the 120s/80 to 130/80. However, whenever she comes to the doctors it is elevated. She also has ankle pain today. ) Treatments tried: Amlodipine  5 mg. The current treatment provides significant improvement. There are no compliance problems.   Ankle Pain  Incident onset: Left ankle pain since yesterday. She felt this might be due to her new shoe which she wore all day. She is also flat footed. There was no injury mechanism. The pain is present in the left ankle. The quality of the pain is described as aching. The pain is at a severity of 0/10 (Pain was 8/10 in severity yesterday. However, today her pain and swelling are gone).   Menstrual irregularity: LMP was 1 year ago. Her symptoms started 2 years ago with heavy menstrual bleeding. However, for about a year, she has not had her period. She has not been sexually active for four years and is currently not on any birth control.    PERTINENT  PMH / PSH: PMHx reviewed  OBJECTIVE:   BP (!) 134/104   Pulse 88   Ht 5\' 2"  (1.575 m)   Wt (!) 422 lb (191.4 kg)   LMP  (LMP Unknown)   SpO2 100%   BMI 77.18 kg/m   Physical Exam Vitals and nursing note reviewed.  Cardiovascular:     Rate and Rhythm: Normal rate and regular rhythm.     Heart sounds: Normal heart sounds. No murmur heard. Pulmonary:     Effort: Pulmonary effort is normal. No respiratory distress.     Breath sounds: Normal breath sounds. No wheezing.  Abdominal:     General: Abdomen is flat. Bowel sounds are normal. There is no distension.     Palpations: Abdomen is soft. There is no  mass.     Tenderness: There is no abdominal tenderness.  Genitourinary:    Comments: Deferred Musculoskeletal:     Right lower leg: No edema.     Left lower leg: No edema.     Comments: Mild left lateral malleoli tenderness with no swelling or erythema      ASSESSMENT/PLAN:   Assessment & Plan Essential hypertension Per the patient, her home BP readings are good She uses wrist cuff BP machine She has no neurologic or cardiopulm symptoms related to HTN Initial systolic BP checked was pretty elevated with HR of 121 Repeat BP showed improved systolic BP with persistently elevated diastolic on two checks Her HR however improved Plan to increase Norvasc  to 10 mg every day BP parameters provided She will take 5 mg Norvasc  if BP is consistently below 110/70 at home F/U in 2 - 4 weeks recommended or sooner if needed Acute left ankle pain Likely due to overuse ?? Some underlying arthritis Symptoms improved since yesterday Consider an xray if this persists or reoccurs Monitor closely for now Amenorrhea ?? PCOS/Anovulation Upreg declined as she has not been sexually active in over 4 years Hormonal lab checked Pelvic US  ordered and was scheduled I will contact her with results and management plan following test results   Declined PAP today, will schedule with PCP.  Penni Bowman, MD [facility]  Family Medicine Center

## 2023-08-28 NOTE — Progress Notes (Signed)
 Yes, I scheduled it this morning and gave patient appointment information.

## 2023-08-28 NOTE — Assessment & Plan Note (Addendum)
 Per the patient, her home BP readings are good She uses wrist cuff BP machine She has no neurologic or cardiopulm symptoms related to HTN Initial systolic BP checked was pretty elevated with HR of 121 Repeat BP showed improved systolic BP with persistently elevated diastolic on two checks Her HR however improved Plan to increase Norvasc  to 10 mg every day BP parameters provided She will take 5 mg Norvasc  if BP is consistently below 110/70 at home F/U in 2 - 4 weeks recommended or sooner if needed

## 2023-08-29 ENCOUNTER — Other Ambulatory Visit: Payer: Self-pay | Admitting: Family Medicine

## 2023-08-29 ENCOUNTER — Encounter: Payer: Self-pay | Admitting: Family Medicine

## 2023-08-29 MED ORDER — VITAMIN D (ERGOCALCIFEROL) 50000 UNITS PO CAPS: 1.0000 | ORAL_CAPSULE | ORAL | 0 refills | Status: DC

## 2023-08-31 ENCOUNTER — Encounter: Payer: Self-pay | Admitting: Family Medicine

## 2023-08-31 LAB — ALB+PRL+FSH+LH+PROG+DHEA+ES...
Albumin: 4 g/dL (ref 3.9–4.9)
Dehydroepiandrosterone: 283 ng/dL (ref 31–701)
Estrogen: 214 pg/mL
FSH: 5.9 m[IU]/mL
LH: 8.1 m[IU]/mL
Progesterone: 0.2 ng/mL
Prolactin: 6.6 ng/mL (ref 4.8–33.4)
Sex Hormone Binding: 37.2 nmol/L (ref 24.6–122.0)
Vit D, 25-Hydroxy: 15.6 ng/mL — ABNORMAL LOW (ref 30.0–100.0)

## 2023-09-03 ENCOUNTER — Ambulatory Visit (HOSPITAL_COMMUNITY)

## 2023-09-10 ENCOUNTER — Ambulatory Visit (HOSPITAL_COMMUNITY)

## 2023-09-22 ENCOUNTER — Encounter: Payer: Self-pay | Admitting: Family Medicine

## 2023-09-22 ENCOUNTER — Other Ambulatory Visit: Payer: Self-pay | Admitting: Family Medicine

## 2023-09-22 MED ORDER — AMLODIPINE BESYLATE 10 MG PO TABS: 10.0000 mg | ORAL_TABLET | Freq: Every day | ORAL | 0 refills | Status: DC

## 2023-09-22 NOTE — Progress Notes (Signed)
 Called pharmacy to cancel all 5 mg Amlodipine  ordered previously  - Spoke with Kamryon at the pharmacy who confirmed medication cancellation.

## 2023-10-01 ENCOUNTER — Ambulatory Visit (HOSPITAL_COMMUNITY)

## 2023-10-05 ENCOUNTER — Ambulatory Visit (HOSPITAL_COMMUNITY): Attending: Family Medicine

## 2023-10-08 ENCOUNTER — Encounter: Payer: Self-pay | Admitting: Family Medicine

## 2023-10-08 ENCOUNTER — Ambulatory Visit (INDEPENDENT_AMBULATORY_CARE_PROVIDER_SITE_OTHER): Admitting: Family Medicine

## 2023-10-08 VITALS — BP 138/81 | HR 89 | Ht 62.0 in | Wt >= 6400 oz

## 2023-10-08 DIAGNOSIS — I10 Essential (primary) hypertension: Secondary | ICD-10-CM | POA: Diagnosis not present

## 2023-10-08 DIAGNOSIS — E66813 Obesity, class 3: Secondary | ICD-10-CM

## 2023-10-08 DIAGNOSIS — N911 Secondary amenorrhea: Secondary | ICD-10-CM

## 2023-10-08 MED ORDER — MOUNJARO 2.5 MG/0.5ML ~~LOC~~ SOAJ: 2.5000 mg | SUBCUTANEOUS | 0 refills | Status: DC

## 2023-10-08 MED ORDER — AMLODIPINE BESYLATE 5 MG PO TABS: 5.0000 mg | ORAL_TABLET | Freq: Every day | ORAL | 3 refills | Status: DC

## 2023-10-08 NOTE — Progress Notes (Signed)
 Paula Drake is alone Sources of clinical information for visit is/are patient. Nursing assessment for this office visit was reviewed with the patient for accuracy and revision.     Previous Report(s) Reviewed: none     10/08/2023    8:54 AM  Depression screen PHQ 2/9  Decreased Interest 1  Down, Depressed, Hopeless 1  PHQ - 2 Score 2  Altered sleeping 1  Tired, decreased energy 1  Change in appetite 1  Feeling bad or failure about yourself  1  Trouble concentrating 1  Moving slowly or fidgety/restless 1  Suicidal thoughts 1  PHQ-9 Score 9   Flowsheet Row Office Visit from 10/08/2023 in [facility] Of [place]. [facility] Office Visit from 07/20/2023 in [facility] - [facility] Dept Of [place]. [facility] Office Visit from 03/12/2023 in [facility] - A Dept Of [place]. [facility]  Thoughts that you would be better off dead, or of hurting yourself in some way Several days Not at all Not at all  PHQ-9 Total Score 9 9 7        10/16/2022   10:02 AM 01/19/2017   10:21 AM 10/30/2016   11:22 AM 05/16/2016   11:41 AM 09/11/2015    4:07 PM  Fall Risk   Falls in the past year? 0 No  No  No  No   Number falls in past yr: 0      Injury with Fall? 0      Risk for fall due to : No Fall Risks         Data saved with a previous flowsheet row definition       10/08/2023    8:54 AM 07/20/2023   10:56 AM 03/12/2023   10:27 AM  PHQ9 SCORE ONLY  PHQ-9 Total Score 9 9 7     There are no preventive care reminders to display for this patient.  Health Maintenance Due  Topic Date Due   Cervical Cancer Screening (HPV/Pap Cotest)  07/27/2023      History/P.E. limitations: none  There are no preventive care reminders to display for this patient. There are no preventive care reminders to display for this patient.  Health Maintenance Due  Topic Date Due   Cervical Cancer Screening (HPV/Pap Cotest)  07/27/2023      Chief Complaint  Patient presents with   Hypertension   Weight loss meds     --------------------------------------------------------------------------------------------------------------------------------------------- Visit Problem List with A/P  No problem-specific Assessment & Plan notes found for this encounter.

## 2023-10-08 NOTE — Patient Instructions (Signed)
 A referral was made to the gynecologist to look into why you are not having your periods.   We are checking your testosterone  level today to see if there is evidence of PCOS.  A prescription for Mounjaro was sent to your mail order pharmacy.  It will take us  filling out a prior approval to see if it can be covered by your insurance.   A prescription for the Amlodipine  5 mg tablet was sent to pharmacy.

## 2023-10-09 ENCOUNTER — Ambulatory Visit: Payer: Self-pay | Admitting: Family Medicine

## 2023-10-09 ENCOUNTER — Encounter: Payer: Self-pay | Admitting: Family Medicine

## 2023-10-09 LAB — TESTOSTERONE: Testosterone: 55 ng/dL (ref 8–60)

## 2023-10-09 NOTE — Assessment & Plan Note (Addendum)
 Wt Readings from Last 3 Encounters:  10/08/23 (!) 421 lb 3.2 oz (191.1 kg)  08/28/23 (!) 422 lb (191.4 kg)  07/23/23 (!) 420 lb 6.4 oz (190.7 kg)   Ms Verrastro has had many years of attempting weight loss with diet and exercise without significant success. She has tried low calorie diets, high protein diets, and fasting diet.  She greatly wants to reduce her weight so work and recreation are less taxing on her.   She is going to the Gym three days a week of both aerobic and resistance training for over 45 minutes a session.  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   Ms Hodgkin greatly desires to lose weight.  She recognizes the health consequences of her obesity, and desperately wants to address the problem. She is asking about Mounjaro  as an option to supplement her current and planned weight loss efforts.   A/P I believe Ms Kutscher's desire to lose weight for better health and functioning is sincere.  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  Prescription for Mounjaro  2.5 mg [place] weekly sent to pharmacy.  I anticipate a PA will be necessary for any chance of having insurance coverage of the med's cost.

## 2023-10-09 NOTE — Assessment & Plan Note (Signed)
 Established problem Ms Osias has not had a period for several months.  Dr Grandville Lax obtianed random measure of patient's LH, FSH, Prolactin, Progesterone and Estrogen.  All values were within normal limits. TSH 07/23/23 was within normal limits.   The pelvic/TVUS ordered by Dr Grandville Lax has not occurred.  Ms Mittman's US  was scheduled while she was in our office today.   Will check testoterone level today..  Ms Auth requested referral to GYN for further evaluation of her amenorrheic state. Referral was made

## 2023-10-09 NOTE — Assessment & Plan Note (Signed)
 Established problem that has improved and has meet goal of < 130/80.  Paula Drake has reduced her Amlodipine  10 mg tab, half a tablet a day.   She reduced the dose because she noted increase in leg swelling.   Plan Continue Amlodipine  at 5 mg daily. Prescription for Amlodipine  5 mg tablets sent to pharmacy

## 2023-10-12 ENCOUNTER — Telehealth: Payer: Self-pay

## 2023-10-12 DIAGNOSIS — I10 Essential (primary) hypertension: Secondary | ICD-10-CM

## 2023-10-12 DIAGNOSIS — E66813 Obesity, class 3: Secondary | ICD-10-CM

## 2023-10-12 NOTE — Telephone Encounter (Signed)
 Ozempic /Mounjaro  is approved exclusively as an adjunct to diet and exercise to improve glycemic control in adults with type 2 diabetes mellitus. A review of patient's medical chart reveals no documented diagnosis of type 2 diabetes or an A1C indicative of diabetes. Therefore, they do not currently meet the criteria for prior authorization of this medication. If clinically appropriate, alternative options such as Saxenda , Zepbound , or Wegovy  may be considered for this patient.  *****If the A1C from today comes back over 6.5 and there is a new Type 2 diabetes diagnosis, we can run the prior authorization then.  A user error has taken place: encounter opened in error, closed for administrative reasons.

## 2023-10-13 MED ORDER — TIRZEPATIDE-WEIGHT MANAGEMENT 2.5 MG/0.5ML ~~LOC~~ SOLN: 2.5000 mg | SUBCUTANEOUS | 0 refills | Status: AC

## 2023-10-13 NOTE — Telephone Encounter (Signed)
 Prescription sent for Zepbound for morbid obesity with BMI > 70% and obesity-related hypertension

## 2023-10-14 ENCOUNTER — Other Ambulatory Visit (HOSPITAL_COMMUNITY): Payer: Self-pay

## 2023-10-15 NOTE — Telephone Encounter (Signed)
 Pharmacy Patient Advocate Encounter  Received notification from [employer] that Prior Authorization for ZEPBOUND has been DENIED.  Full denial letter will be uploaded to the media tab. See denial reason below.    PA #/Case ID/Reference #: [id number]

## 2023-10-16 ENCOUNTER — Ambulatory Visit (HOSPITAL_COMMUNITY)
Admission: RE | Admit: 2023-10-16 | Discharge: 2023-10-16 | Disposition: A | Discharge status: Home / Self Care | Source: Ambulatory Visit | Attending: Family Medicine | Admitting: Family Medicine

## 2023-10-16 ENCOUNTER — Other Ambulatory Visit: Payer: Self-pay

## 2023-10-16 DIAGNOSIS — N926 Irregular menstruation, unspecified: Secondary | ICD-10-CM | POA: Diagnosis present

## 2023-10-16 MED ORDER — VITAMIN D (ERGOCALCIFEROL) 50000 UNITS PO CAPS: 1.0000 | ORAL_CAPSULE | ORAL | 1 refills | Status: AC

## 2023-10-23 MED ORDER — PHENTERMINE HCL 37.5 MG PO CAPS: ORAL_CAPSULE | ORAL | 1 refills | Status: DC

## 2023-10-23 NOTE — Telephone Encounter (Signed)
 Spoke with patient. She stated that she will call the office to make the appointments that Dr. McDiarmid are wanting her to make. Nelson Land, CMA

## 2023-10-23 NOTE — Telephone Encounter (Signed)
 Prescription Phentermine 37.5 mg tab, one tab before breakfast daily. Nurse visit in one week for BP check. Video visit in 4-5 weeks to assess tolerance and efficacy.

## 2023-10-23 NOTE — Telephone Encounter (Signed)
-----   Message from [facility] McDiarmid sent at 10/23/2023  9:04 AM EDT ----- Regarding: Schedule nurse visits and video visit Please schedule Ms Malcolm for 1) Nurse visit in about a week for blood pressure check 2) Nurse visit in about 4 weeks for weight and blood pressure check 3) Video visit with McDiarmid soon after the second nurse visit.   Thank you,  Krystal

## 2023-10-23 NOTE — Addendum Note (Signed)
 Addended by: KENETH BOAS D on: 10/23/2023 09:12 AM   Modules accepted: Orders

## 2023-10-26 ENCOUNTER — Ambulatory Visit: Payer: Self-pay | Admitting: Family Medicine

## 2023-12-10 ENCOUNTER — Ambulatory Visit (INDEPENDENT_AMBULATORY_CARE_PROVIDER_SITE_OTHER): Admitting: Family Medicine

## 2023-12-10 ENCOUNTER — Encounter: Payer: Self-pay | Admitting: Family Medicine

## 2023-12-10 VITALS — BP 140/106 | HR 91 | Ht 62.0 in | Wt >= 6400 oz

## 2023-12-10 DIAGNOSIS — L0291 Cutaneous abscess, unspecified: Secondary | ICD-10-CM

## 2023-12-10 DIAGNOSIS — L989 Disorder of the skin and subcutaneous tissue, unspecified: Secondary | ICD-10-CM | POA: Diagnosis not present

## 2023-12-10 DIAGNOSIS — I10 Essential (primary) hypertension: Secondary | ICD-10-CM

## 2023-12-10 DIAGNOSIS — E66813 Obesity, class 3: Secondary | ICD-10-CM

## 2023-12-10 MED ORDER — DOXYCYCLINE HYCLATE 100 MG PO TABS: 100.0000 mg | ORAL_TABLET | Freq: Two times a day (BID) | ORAL | 0 refills | Status: DC

## 2023-12-10 MED ORDER — NALTREXONE-BUPROPION HCL ER 8-90 MG PO TB12: 2.0000 | ORAL_TABLET | Freq: Two times a day (BID) | ORAL | 2 refills | Status: DC

## 2023-12-10 NOTE — Progress Notes (Signed)
 Paula Drake is alone Sources of clinical information for visit is/are patient. Nursing assessment for this office visit was reviewed with the patient for accuracy and revision.   Previous Report(s) Reviewed: none     10/08/2023    8:54 AM  Depression screen PHQ 2/9  Decreased Interest 1  Down, Depressed, Hopeless 1  PHQ - 2 Score 2  Altered sleeping 1  Tired, decreased energy 1  Change in appetite 1  Feeling bad or failure about yourself  1  Trouble concentrating 1  Moving slowly or fidgety/restless 1  Suicidal thoughts 1  PHQ-9 Score 9   Flowsheet Row Office Visit from 10/08/2023 in [facility] Of [place]. [facility] Office Visit from 07/20/2023 in [facility] - [facility] Dept Of [place]. [facility] Office Visit from 03/12/2023 in [facility] - A Dept Of [place]. [facility]  Thoughts that you would be better off dead, or of hurting yourself in some way Several days Not at all Not at all  PHQ-9 Total Score 9 9 7        10/16/2022   10:02 AM 01/19/2017   10:21 AM 10/30/2016   11:22 AM 05/16/2016   11:41 AM 09/11/2015    4:07 PM  Fall Risk   Falls in the past year? 0 No  No  No  No   Number falls in past yr: 0      Injury with Fall? 0      Risk for fall due to : No Fall Risks         Data saved with a previous flowsheet row definition       10/08/2023    8:54 AM 07/20/2023   10:56 AM 03/12/2023   10:27 AM  PHQ9 SCORE ONLY  PHQ-9 Total Score 9 9 7     There are no preventive care reminders to display for this patient.  Health Maintenance Due  Topic Date Due   Hepatitis B Vaccines 19-59 Average Risk (1 of 3 - 19+ 3-dose series) Never done   Cervical Cancer Screening (HPV/Pap Cotest)  07/27/2023   INFLUENZA VACCINE  11/27/2023      History/P.E. limitations: none  There are no preventive care reminders to display for this patient. There are no preventive care reminders to display for this  patient.  Health Maintenance Due  Topic Date Due   Hepatitis B Vaccines 19-59 Average Risk (1 of 3 - 19+ 3-dose series) Never done   Cervical Cancer Screening (HPV/Pap Cotest)  07/27/2023   INFLUENZA VACCINE  11/27/2023     Chief Complaint  Patient presents with   Annual Exam     Discussed the use of AI scribe software for clinical note transcription with the patient, who gave verbal consent to proceed.  History of Present Illness      SDOH Screenings   Food Insecurity: No Food Insecurity (07/20/2023)  Housing: High Risk (07/20/2023)  Transportation Needs: No Transportation Needs (07/20/2023)  Depression (PHQ2-9): Medium Risk (10/08/2023)  Financial Resource Strain: Low Risk  (07/20/2023)  Physical Activity: Insufficiently Active (07/20/2023)  Social Connections: Socially Isolated (07/20/2023)  Stress: Stress Concern Present (07/20/2023)  Tobacco Use: Low Risk  (12/10/2023)   --------------------------------------------------------------------------------------------------------------------------------------------- Visit Problem List with Assessment and Plan   Assessment and Plan Assessment & Plan      No problem-specific Assessment & Plan notes found for this encounter.

## 2023-12-10 NOTE — Patient Instructions (Addendum)
 Let's see if we can get Contrave  (Naltrexone -Bupropion  combination for weight loss.  If we can, take 2 tablets twice a day.  We would want to follow up your weight after 3 months on this medication/  If you want to try the Phentermine , try it on the weekends in case it makes you jittery.   If you want to consult with a Bariatrician (weight doctory), let Dr Nakota Ackert know.  He will refer you to [facility].    Start the Doxyclycline 100 mg tabelts, take one tablet twice a day for 10 days.  Avoid direct direct sunlight because you can sunburn easier.

## 2023-12-11 ENCOUNTER — Encounter: Payer: Self-pay | Admitting: Family Medicine

## 2023-12-11 ENCOUNTER — Emergency Department (HOSPITAL_COMMUNITY)
Admission: EM | Admit: 2023-12-11 | Discharge: 2023-12-12 | Disposition: A | Discharge status: Home / Self Care | Attending: Emergency Medicine | Admitting: Emergency Medicine

## 2023-12-11 DIAGNOSIS — I10 Essential (primary) hypertension: Secondary | ICD-10-CM | POA: Diagnosis present

## 2023-12-11 DIAGNOSIS — Z79899 Other long term (current) drug therapy: Secondary | ICD-10-CM | POA: Diagnosis not present

## 2023-12-11 DIAGNOSIS — L989 Disorder of the skin and subcutaneous tissue, unspecified: Secondary | ICD-10-CM | POA: Insufficient documentation

## 2023-12-11 DIAGNOSIS — R0789 Other chest pain: Secondary | ICD-10-CM | POA: Insufficient documentation

## 2023-12-11 HISTORY — DX: Essential (primary) hypertension: I10

## 2023-12-11 HISTORY — DX: Disorder of the skin and subcutaneous tissue, unspecified: L98.9

## 2023-12-11 NOTE — Assessment & Plan Note (Signed)
 Established problem Uncontrolled.  Patient is not at goal of SBP < 140/90. Taking Amlodipine  5 mg daily (dose reduced from 10 mg because of leg edema)  Start: Trial of Amlodipine  5 mg tablet, 1.5 tablet (7.5 mg) daily

## 2023-12-11 NOTE — Assessment & Plan Note (Addendum)
 Wt Readings from Last 3 Encounters:  12/10/23 (!) 420 lb 2 oz (190.6 kg)  10/08/23 (!) 421 lb 3.2 oz (191.1 kg)  08/28/23 (!) 422 lb (191.4 kg)   Paula Drake chose not to start Phentermine  because of concerns of adverse cardiac symptoms.  Paula Drake has tolerated Welbutrin XL 150 mg in past for depression  Will see if naltrexone /bupropion  Contrave  8/90 2 tablets bid is covered. If so, will titrate dose up weekly by one tablet until taking two tablets twice a day with second dose occurring before 3 PM.

## 2023-12-11 NOTE — Assessment & Plan Note (Signed)
 New tender bumps over lower midline abdomin No fever/chills Patient declines offer of examination. She is to be seen by her GYN in two weeks.  She says she will report her concern to them.   A/ Possible skin abscess/hidradenitis  P/ Doxycycline  100 mg twice a day x 10 days.

## 2023-12-12 ENCOUNTER — Encounter (HOSPITAL_COMMUNITY): Payer: Self-pay | Admitting: *Deleted

## 2023-12-12 ENCOUNTER — Other Ambulatory Visit: Payer: Self-pay

## 2023-12-12 LAB — COMPREHENSIVE METABOLIC PANEL WITH GFR
ALT: 10 U/L (ref 0–44)
AST: 20 U/L (ref 15–41)
Albumin: 3.2 g/dL — ABNORMAL LOW (ref 3.5–5.0)
Alkaline Phosphatase: 66 U/L (ref 38–126)
Anion gap: 10 (ref 5–15)
BUN: 16 mg/dL (ref 6–20)
CO2: 22 mmol/L (ref 22–32)
Calcium: 8.9 mg/dL (ref 8.9–10.3)
Chloride: 103 mmol/L (ref 98–111)
Creatinine, Ser: 1.07 mg/dL — ABNORMAL HIGH (ref 0.44–1.00)
GFR, Estimated: >60 mL/min (ref >60)
Glucose, Bld: 103 mg/dL — ABNORMAL HIGH (ref 70–99)
Potassium: 3.5 mmol/L (ref 3.5–5.1)
Sodium: 135 mmol/L (ref 135–145)
Total Bilirubin: 0.5 mg/dL (ref 0.0–1.2)
Total Protein: 7.1 g/dL (ref 6.5–8.1)

## 2023-12-12 LAB — CBC
HCT: 40.2 % (ref 36.0–46.0)
Hemoglobin: 12.8 g/dL (ref 12.0–15.0)
MCH: 27.9 pg (ref 26.0–34.0)
MCHC: 31.8 g/dL (ref 30.0–36.0)
MCV: 87.6 fL (ref 80.0–100.0)
Platelets: 258 K/uL (ref 150–400)
RBC: 4.59 MIL/uL (ref 3.87–5.11)
RDW: 13.8 % (ref 11.5–15.5)
WBC: 10.3 K/uL (ref 4.0–10.5)
nRBC: 0 % (ref 0.0–0.2)

## 2023-12-12 LAB — URINALYSIS, ROUTINE W REFLEX MICROSCOPIC
Bilirubin Urine: NEGATIVE
Glucose, UA: NEGATIVE mg/dL
Hgb urine dipstick: NEGATIVE
Ketones, ur: NEGATIVE mg/dL
Leukocytes,Ua: NEGATIVE
Nitrite: NEGATIVE
Protein, ur: NEGATIVE mg/dL
Specific Gravity, Urine: 1.014 (ref 1.005–1.030)
pH: 5 (ref 5.0–8.0)

## 2023-12-12 LAB — HCG, SERUM, QUALITATIVE: Preg, Serum: NEGATIVE

## 2023-12-12 LAB — LIPASE, BLOOD: Lipase: 31 U/L (ref 11–51)

## 2023-12-12 LAB — TROPONIN I (HIGH SENSITIVITY)
Troponin I (High Sensitivity): 3 ng/L (ref <18)
Troponin I (High Sensitivity): 3 ng/L (ref <18)

## 2023-12-12 NOTE — ED Provider Notes (Signed)
 [place] EMERGENCY DEPARTMENT AT [facility] Provider Note   CSN: [id number] Arrival date & time: 12/11/23  2348     Patient presents with: Hypertension   Paula Drake is a 33 y.o. female.   33 year old female with prior medical history as detailed below presents for evaluation.  Patient reports that her blood pressure has been running high for the last 1 days.  Her PCP increased her blood pressure medication on Thursday.  She noticed continued elevations in her blood pressure over the next 24 to 48 hours.  She reports some associated atypical chest discomfort.  The symptoms caused her to come to the ED for evaluation.  She had a lengthy wait time secondary to high ED volume.  She has been waiting in the waiting room for 9 hours prior to my evaluation.  She describes atypical achiness in her left chest.  This resolved within an hour of her arrival to the ED.  She has not had recurrent symptoms.  She denies current chest pain, shortness of breath, nausea, vomiting, other complaint.  She reports a longstanding history of anxiety.  She reports that she was quite anxious about her elevated blood pressure.  She expected the increased dose of blood pressure medication started on Thursday to have already controlled her blood pressure.  The history is provided by the patient and medical records.       Prior to Admission medications   Medication Sig Start Date End Date Taking? Authorizing Provider  amLODipine  (NORVASC ) 5 MG tablet Take 1 tablet (5 mg total) by mouth at bedtime. 10/08/23   McDiarmid, Krystal BIRCH, MD  Blood Pressure Monitoring (BLOOD PRESSURE KIT) DEVI 1 kit by Does not apply route daily. 03/13/23   McDiarmid, Krystal BIRCH, MD  doxycycline  (VIBRA -TABS) 100 MG tablet Take 1 tablet (100 mg total) by mouth 2 (two) times daily. 12/10/23   McDiarmid, Krystal BIRCH, MD  Naltrexone -buPROPion  HCl ER 8-90 MG TB12 Take 2 tablets by mouth 2 (two) times daily. 12/10/23   McDiarmid, Krystal BIRCH, MD   Vitamin D , Ergocalciferol , 50000 units CAPS Take 1 tablet by mouth once a week. 10/16/23   McDiarmid, Krystal BIRCH, MD    Allergies: Patient has no known allergies.    Review of Systems  All other systems reviewed and are negative.   Updated Vital Signs BP (!) 163/94 (BP Location: Right Wrist)   Pulse 83   Temp 98.1 F (36.7 C)   Resp 18   Ht 5' 2 (1.575 m)   Wt (!) 190.6 kg   SpO2 100%   BMI 76.86 kg/m   Physical Exam Vitals and nursing note reviewed.  Constitutional:      General: She is not in acute distress.    Appearance: Normal appearance. She is well-developed.  HENT:     Head: Normocephalic and atraumatic.  Eyes:     Conjunctiva/sclera: Conjunctivae normal.     Pupils: Pupils are equal, round, and reactive to light.  Cardiovascular:     Rate and Rhythm: Normal rate and regular rhythm.     Heart sounds: Normal heart sounds.  Pulmonary:     Effort: Pulmonary effort is normal. No respiratory distress.     Breath sounds: Normal breath sounds.  Abdominal:     General: There is no distension.     Palpations: Abdomen is soft.     Tenderness: There is no abdominal tenderness.  Musculoskeletal:        General: No deformity. Normal range of  motion.     Cervical back: Normal range of motion and neck supple.  Skin:    General: Skin is warm and dry.  Neurological:     General: No focal deficit present.     Mental Status: She is alert and oriented to person, place, and time.     (all labs ordered are listed, but only abnormal results are displayed) Labs Reviewed  COMPREHENSIVE METABOLIC PANEL WITH GFR - Abnormal; Notable for the following components:      Result Value   Glucose, Bld 103 (*)    Creatinine, Ser 1.07 (*)    Albumin 3.2 (*)    All other components within normal limits  LIPASE, BLOOD  CBC  URINALYSIS, ROUTINE W REFLEX MICROSCOPIC  HCG, SERUM, QUALITATIVE  TROPONIN I (HIGH SENSITIVITY)  TROPONIN I (HIGH SENSITIVITY)    EKG: EKG  Interpretation Date/Time:  Saturday December 12 2023 00:19:02 EDT Ventricular Rate:  87 PR Interval:  154 QRS Duration:  76 QT Interval:  372 QTC Calculation: 447 R Axis:   61  Text Interpretation: Normal sinus rhythm Normal ECG When compared with ECG of 31-Dec-2022 14:32, PREVIOUS ECG IS PRESENT Confirmed by Laurice Coy [phone]) on 12/12/2023 8:35:17 AM  Radiology: No results found.   Procedures   Medications Ordered in the ED - No data to display                                  Medical Decision Making Patient with longstanding history of both hypertension and anxiety.  Patient with mildly increased blood pressures over the last several days per her report.  PCP for blood pressure medication on Thursday.  Patient expected that this would have started to affect her blood pressure increased already.  She described atypical chest discomfort.  EKG is without evidence of acute ischemia.  Troponin x 2 is without significant elevation or delta.  Other screening labs obtained are without significant abnormality.  Patient reassured by workup findings.  Patient educated about blood pressure management.    Importance of close follow-up is stressed.  Strict return precautions given and understood.        Final diagnoses:  Hypertension, unspecified type    ED Discharge Orders     None          Laurice Coy BROCKS, MD 12/12/23 [phone]

## 2023-12-12 NOTE — ED Triage Notes (Signed)
 The pt is c/o her bp being too high even after her bp meds were increased she is also c/o some lt arm numbness  lmp    on year

## 2023-12-12 NOTE — Discharge Instructions (Signed)
 Return for any problem.  ?

## 2023-12-14 ENCOUNTER — Encounter: Payer: Self-pay | Admitting: Family Medicine

## 2023-12-14 ENCOUNTER — Telehealth: Payer: Self-pay

## 2023-12-14 ENCOUNTER — Telehealth: Payer: Self-pay | Admitting: Family Medicine

## 2023-12-14 DIAGNOSIS — I10 Essential (primary) hypertension: Secondary | ICD-10-CM

## 2023-12-14 DIAGNOSIS — F410 Panic disorder [episodic paroxysmal anxiety] without agoraphobia: Secondary | ICD-10-CM

## 2023-12-14 MED ORDER — HYDROCHLOROTHIAZIDE 12.5 MG PO CAPS: 12.5000 mg | ORAL_CAPSULE | Freq: Every day | ORAL | 0 refills | Status: DC

## 2023-12-14 MED ORDER — SERTRALINE HCL 50 MG PO TABS: 25.0000 mg | ORAL_TABLET | Freq: Every day | ORAL | 3 refills | Status: AC

## 2023-12-14 MED ORDER — HYDROXYZINE HCL 10 MG PO TABS: 5.0000 mg | ORAL_TABLET | Freq: Three times a day (TID) | ORAL | 2 refills | Status: AC | PRN

## 2023-12-14 NOTE — Telephone Encounter (Signed)
-----   Message from [facility] McDiarmid sent at 12/14/2023  9:54 AM EDT ----- Regarding: Schedule appointment Please schedule Ms Dobransky a follow up appointment with me in 5 to 6 weeks to follow up her hypertension and panic disorder.  Thank you Krystal

## 2023-12-14 NOTE — Telephone Encounter (Signed)
 I spoke with Paula Drake.  She made a ED visit on 12/11/23 with elevated blood pressure she was measuring at home with a cuff that fits around her forearm.    She had accompanying acute anxiety symptoms she thought were panic attacks. A Ziopatch from April 2025 was within normal with no correlation of reported events to an abnormal rhythm.   Paula Brawley has a history of panic disorder & depression.  She had been treated with sertraline  and hydroxyzine  prn. She reported having stopped these medications on 07/21/23 MyChart messaging with Dr C. Delores.   Paula Bellisario was unable to afford the prescription for Contrave . She did not start phentermine .   A/ Panic Disorder, exacerbation Essential Hypertension, uncontrolled  P/ Discussion time to maximal therapeutic effect of Amlodipine  is about a week.   Continue monitoring home BP and record results.  Discussed the red flag for high BP of 220/110 as reason to seek urgent healthcare.   Paula Towner agreed to temporary use of hydrochlorothiazide  to augment the higher dose of Amlodipine  with goal of stopping hydrochlorothiazide  in couple months after adequate BP control is achieved.   Restart Sertraline  50 mg tablet, half-tablet po daily for one week, then increase to one tablet daily.   Restart Hydroxyzine  10 mg tablet, half tablet three times a day  as needed for anxiety whil awaiting onset of action of the sertraline .   Work excuse note for today and tomorrow sent via MyChart as Paula Hipps's anxiety level is high at the moment.  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   Return to clinic in 5 weeks to review BP and symptoms.

## 2023-12-14 NOTE — Telephone Encounter (Signed)
 Patient is scheduled with Dr.Mcdiarmid 09/11 @10 

## 2023-12-14 NOTE — Telephone Encounter (Signed)
 Patient called saying she just spoke to Dr. McDiarmid about a letter for work. Wants to let him know that she wants a letter saying that she can work from home instead of being written out of work please.

## 2023-12-14 NOTE — Telephone Encounter (Signed)
 Completed.

## 2023-12-17 ENCOUNTER — Encounter: Payer: Self-pay | Admitting: Family Medicine

## 2023-12-22 ENCOUNTER — Ambulatory Visit: Admitting: Nurse Practitioner

## 2024-01-07 ENCOUNTER — Encounter: Payer: Self-pay | Admitting: Family Medicine

## 2024-01-07 ENCOUNTER — Ambulatory Visit (INDEPENDENT_AMBULATORY_CARE_PROVIDER_SITE_OTHER): Admitting: Family Medicine

## 2024-01-07 VITALS — BP 134/99 | HR 107 | Ht 62.0 in | Wt >= 6400 oz

## 2024-01-07 DIAGNOSIS — I10 Essential (primary) hypertension: Secondary | ICD-10-CM | POA: Diagnosis not present

## 2024-01-07 DIAGNOSIS — Z23 Encounter for immunization: Secondary | ICD-10-CM

## 2024-01-07 DIAGNOSIS — F410 Panic disorder [episodic paroxysmal anxiety] without agoraphobia: Secondary | ICD-10-CM

## 2024-01-07 DIAGNOSIS — Z79899 Other long term (current) drug therapy: Secondary | ICD-10-CM

## 2024-01-07 DIAGNOSIS — N911 Secondary amenorrhea: Secondary | ICD-10-CM

## 2024-01-07 DIAGNOSIS — E66813 Obesity, class 3: Secondary | ICD-10-CM

## 2024-01-07 DIAGNOSIS — N939 Abnormal uterine and vaginal bleeding, unspecified: Secondary | ICD-10-CM

## 2024-01-07 MED ORDER — OLMESARTAN MEDOXOMIL 20 MG PO TABS: 20.0000 mg | ORAL_TABLET | Freq: Every day | ORAL | 3 refills | Status: DC

## 2024-01-07 NOTE — Patient Instructions (Signed)
 Your blood pressure is high.  Dr Catherine Cubero recommends you add a third blood pressure medication to your amlodipine  10 mg daily and your hydrochlorothiazide .  It is called olmesartan  20 mg tablet.  Take one table each day.  Watch for skin swelling.   Come by the [facility] next week to have your potassium checked.  Occasionally patient's potassium increases when taking olmesartan .  It goes back down as soon as we stop the olmesartan .   Start Moujaro 2.5 mg weekly injections for 4 weeks.  If you are tolerating the medication, we will recommend increasing your Mounjaro  to 5 mg injection weekly for 4 week, then increase each month until reach maximum tolerated dose.

## 2024-01-08 ENCOUNTER — Other Ambulatory Visit (HOSPITAL_COMMUNITY): Payer: Self-pay

## 2024-01-08 NOTE — Assessment & Plan Note (Addendum)
 Established problem Uncontrolled.  Patient is not at goal of <140/90. Start: Olmesartan  20 mg daily  Continue: Amlodipine  5 mg tablets, two tablets daily, and hydrochlorothiazide  12.5 mg daily.   Basic Metabolic Panel next week for potassium Serum serum creatinine   Return to clinic 4 - 6 weeks reassess bp

## 2024-01-08 NOTE — Assessment & Plan Note (Signed)
 Established problem. Stable. Patient is at goal of panic event reduction and sleep improvement. No signs of complications, medication side effects, or red flags. Continue current medications and other regiments.

## 2024-01-08 NOTE — Assessment & Plan Note (Signed)
 Referral to [facility] for oligomenorrhea

## 2024-01-08 NOTE — Assessment & Plan Note (Signed)
 Established problem Paula Drake will start tirzepatide  2.5 mg daily Return to clinic 4 weeks to assess tolerance and possible increasing dose.

## 2024-01-08 NOTE — Addendum Note (Signed)
 Addended by: KENETH BOAS D on: 01/08/2024 08:13 AM   Modules accepted: Orders

## 2024-01-08 NOTE — Progress Notes (Signed)
 Jerrye JULIANNA Mulch is alone Sources of clinical information for visit is/are patient. Nursing assessment for this office visit was reviewed with the patient for accuracy and revision.   Previous Report(s) Reviewed: none     01/07/2024    9:53 AM  Depression screen PHQ 2/9  Decreased Interest 0  Down, Depressed, Hopeless 0  PHQ - 2 Score 0  Altered sleeping 2  Tired, decreased energy 1  Change in appetite 2  Feeling bad or failure about yourself  0  Trouble concentrating 2  Moving slowly or fidgety/restless 0  Suicidal thoughts 0  PHQ-9 Score 7   Flowsheet Row Office Visit from 01/07/2024 in [facility] - [facility] Dept Of [place]. [facility] Office Visit from 10/08/2023 in [facility] - [facility] Dept Of [facility]. [facility] Office Visit from 07/20/2023 in [facility] - A Dept Of [place]. [facility]  Thoughts that you would be better off dead, or of hurting yourself in some way Not at all Several days Not at all  PHQ-9 Total Score 7 9 9        10/16/2022   10:02 AM 01/19/2017   10:21 AM 10/30/2016   11:22 AM 05/16/2016   11:41 AM 09/11/2015    4:07 PM  Fall Risk   Falls in the past year? 0 No  No  No  No   Number falls in past yr: 0      Injury with Fall? 0      Risk for fall due to : No Fall Risks         Data saved with a previous flowsheet row definition       01/07/2024    9:53 AM 10/08/2023    8:54 AM 07/20/2023   10:56 AM  PHQ9 SCORE ONLY  PHQ-9 Total Score 7 9  9       Data saved with a previous flowsheet row definition    There are no preventive care reminders to display for this patient.  Health Maintenance Due  Topic Date Due   Hepatitis B Vaccines 19-59 Average Risk (1 of 3 - 19+ 3-dose series) Never done   Cervical Cancer Screening (HPV/Pap Cotest)  07/27/2023      History/P.E. limitations: none  There are no preventive care reminders to display for this patient. There are no preventive care  reminders to display for this patient.  Health Maintenance Due  Topic Date Due   Hepatitis B Vaccines 19-59 Average Risk (1 of 3 - 19+ 3-dose series) Never done   Cervical Cancer Screening (HPV/Pap Cotest)  07/27/2023     Chief Complaint  Patient presents with   Medical Management of Chronic Issues    BP     Discussed the use of AI scribe software for clinical note transcription with the patient, who gave verbal consent to proceed.  History of Present Illness      SDOH Screenings   Food Insecurity: No Food Insecurity (07/20/2023)  Housing: High Risk (07/20/2023)  Transportation Needs: No Transportation Needs (07/20/2023)  Depression (PHQ2-9): Medium Risk (01/07/2024)  Financial Resource Strain: Low Risk  (07/20/2023)  Physical Activity: Insufficiently Active (07/20/2023)  Social Connections: Socially Isolated (07/20/2023)  Stress: Stress Concern Present (07/20/2023)  Tobacco Use: Low Risk  (01/07/2024)   --------------------------------------------------------------------------------------------------------------------------------------------- Visit Problem List with Assessment and Plan   Assessment and Plan Assessment & Plan      No problem-specific Assessment & Plan  notes found for this encounter.

## 2024-01-13 NOTE — Telephone Encounter (Signed)
 Reviewed.   It is disappointing we have been unable to have Ms Paula Drake cover an effective medication for Ms Paula Drake obesity with associated hypertension.

## 2024-01-15 ENCOUNTER — Other Ambulatory Visit

## 2024-01-19 ENCOUNTER — Ambulatory Visit (INDEPENDENT_AMBULATORY_CARE_PROVIDER_SITE_OTHER): Admitting: Family Medicine

## 2024-01-19 ENCOUNTER — Encounter: Payer: Self-pay | Admitting: Family Medicine

## 2024-01-19 VITALS — BP 138/101 | HR 77 | Ht 62.0 in | Wt >= 6400 oz

## 2024-01-19 DIAGNOSIS — Z79899 Other long term (current) drug therapy: Secondary | ICD-10-CM | POA: Diagnosis not present

## 2024-01-19 DIAGNOSIS — R002 Palpitations: Secondary | ICD-10-CM | POA: Diagnosis not present

## 2024-01-19 DIAGNOSIS — Z6841 Body Mass Index (BMI) 40.0 and over, adult: Secondary | ICD-10-CM

## 2024-01-19 DIAGNOSIS — F411 Generalized anxiety disorder: Secondary | ICD-10-CM | POA: Insufficient documentation

## 2024-01-19 DIAGNOSIS — I10 Essential (primary) hypertension: Secondary | ICD-10-CM | POA: Diagnosis not present

## 2024-01-19 NOTE — Patient Instructions (Signed)
 It was nice meeting you today. Please increase your Hydrochlorothiazide  from 12.5 mg 20 25 mg daily and continue other blood pressure medication at the current dose at nighttime. I will see you back in 2 weeks.

## 2024-01-19 NOTE — Progress Notes (Addendum)
 SUBJECTIVE:   CHIEF COMPLAINT / HPI:   Discussed the use of AI scribe software for clinical note transcription with the patient, who gave verbal consent to proceed.  History of Present Illness   Paula Drake is a 33 year old female with hypertension and anxiety who presents with palpitations and elevated blood pressure.  She has experienced a sensation of pounding in her chest for the past year. Despite normal results from an EKG and a Zio patch, she continues to feel her heart 'overworking' and pounding, especially at night. These episodes are associated with anxiety, and she has not slept in two days due to these symptoms. Hydroxyzine , prescribed for anxiety, induces sleepiness but does not alleviate the anxiety.  She has a history of hypertension and is currently on amlodipine  10 mg daily, hydrochlorothiazide  12.5 mg daily, and Olmesartan  20 mg at bedtime. Despite this regimen, her blood pressure remains elevated, with a recent reading of 210/110 mmHg that led to an emergency room visit. At that time, her blood pressure decreased to 160 mmHg systolic without intervention. She monitors her blood pressure at home, with recent readings of 159/102 mmHg.  She mentions a recent job change but does not find it stressful. She works in [employer] work. She takes vitamin D  supplements due to low levels and reports compliance with this regimen. She also notes a past kidney test showing elevated creatinine levels and was advised to increase protein intake.  Her anxiety is exacerbated by the palpitations, leading to fears of heart attacks or strokes, which she describes as panic attacks. She has not previously seen a cardiologist for these symptoms.     Also stated she was unable to start Mounjaro  for weight management due to insurance coverage issue.   PERTINENT  PMH / PSH: PMHx reviewed  OBJECTIVE:   BP (!) 138/101   Pulse 77   Ht 5' 2 (1.575 m)   Wt (!) 413 lb 12.8 oz (187.7 kg)   LMP  (LMP  Unknown)   SpO2 98%   BMI 75.69 kg/m   Physical Exam   VITALS: BP- 142/107 GEN: No distress. Morbidly obese CHEST: Lungs clear to auscultation, no wheezing. CARDIOVASCULAR: Heart sounds normal, S1 and S2 normal, no murmur. RRR       ASSESSMENT/PLAN:   Assessment & Plan BMI 70 and over, adult (HCC) Mounjaro  not covered by insurance Plan to revisit other oral options for weight treatment, once we stabilize her BP She agreed with the plan    Essential hypertension Hypertension remains uncontrolled with elevated readings, including a recent 210/110 mmHg requiring ER visit. Current reading is 142/107 mmHg. - Increase hydrochlorothiazide  to 25 mg daily. - Continue amlodipine  10 mg daily. - Continue Olmesartan  20 mg daily. - Advise taking all blood pressure medications at night. -Bmet checked today - lab already ordered by PCP - Schedule follow-up in two weeks.  Generalized anxiety disorder with palpitations Experiences palpitations and anxiety, especially at night. Previous EKG, Zio patch, and thyroid tests normal. Anxiety likely contributing to symptoms. - Refer to cardiologist for further evaluation, including possible stress test. - Advise contacting insurance for a psychologist referral for cognitive behavioral therapy.    Palpitation: Likely related to GAD R/O Cardiac issues TSH, EKG, CBC reviewed which are not concerning. Referral to cardiology for second opinion      NB: She wants to switch PCP to me since Dr. Keneth is retiring. This is approved.   Otto Fairly, MD [facility]

## 2024-01-19 NOTE — Assessment & Plan Note (Signed)
 Likely related to GAD R/O Cardiac issues TSH, EKG, CBC reviewed which are not concerning. Referral to cardiology for second opinion

## 2024-01-19 NOTE — Assessment & Plan Note (Addendum)
 Mounjaro  not covered by insurance Plan to revisit other oral options for weight treatment, once we stabilize her BP She agreed with the plan

## 2024-01-19 NOTE — Assessment & Plan Note (Signed)
 Experiences palpitations and anxiety, especially at night. Previous EKG, Zio patch, and thyroid tests normal. Anxiety likely contributing to symptoms. - Refer to cardiologist for further evaluation, including possible stress test. - Advise contacting insurance for a psychologist referral for cognitive behavioral therapy.  - Continue Zoloft  50 mg every day and Hydroxyzine  prn

## 2024-01-20 LAB — BASIC METABOLIC PANEL WITH GFR
BUN/Creatinine Ratio: 14 (ref 9–23)
BUN: 13 mg/dL (ref 6–20)
CO2: 22 mmol/L (ref 20–29)
Calcium: 9.4 mg/dL (ref 8.7–10.2)
Chloride: 101 mmol/L (ref 96–106)
Creatinine, Ser: 0.91 mg/dL (ref 0.57–1.00)
Glucose: 94 mg/dL (ref 70–99)
Potassium: 4.5 mmol/L (ref 3.5–5.2)
Sodium: 139 mmol/L (ref 134–144)
eGFR: 86 mL/min/1.73 (ref >59)

## 2024-01-21 ENCOUNTER — Ambulatory Visit: Payer: Self-pay | Admitting: Family Medicine

## 2024-02-02 ENCOUNTER — Encounter: Payer: Self-pay | Admitting: Family Medicine

## 2024-02-02 ENCOUNTER — Ambulatory Visit (INDEPENDENT_AMBULATORY_CARE_PROVIDER_SITE_OTHER): Admitting: Family Medicine

## 2024-02-02 ENCOUNTER — Other Ambulatory Visit (HOSPITAL_COMMUNITY)
Admission: RE | Admit: 2024-02-02 | Discharge: 2024-02-02 | Disposition: A | Source: Ambulatory Visit | Attending: Family Medicine | Admitting: Family Medicine

## 2024-02-02 VITALS — BP 114/94 | HR 93 | Wt >= 6400 oz

## 2024-02-02 DIAGNOSIS — I10 Essential (primary) hypertension: Secondary | ICD-10-CM | POA: Diagnosis not present

## 2024-02-02 DIAGNOSIS — N911 Secondary amenorrhea: Secondary | ICD-10-CM | POA: Diagnosis not present

## 2024-02-02 DIAGNOSIS — Z124 Encounter for screening for malignant neoplasm of cervix: Secondary | ICD-10-CM | POA: Insufficient documentation

## 2024-02-02 MED ORDER — AMLODIPINE BESYLATE 10 MG PO TABS: 10.0000 mg | ORAL_TABLET | Freq: Every day | ORAL | 1 refills | Status: DC

## 2024-02-02 MED ORDER — HYDROCHLOROTHIAZIDE 25 MG PO TABS: 25.0000 mg | ORAL_TABLET | Freq: Every day | ORAL | 1 refills | Status: DC

## 2024-02-02 NOTE — Assessment & Plan Note (Signed)
 Home readings improved with olmesartan . Office readings variable but improved. - Continue amlodipine  10 mg daily. - Continue hydrochlorothiazide  25 mg daily. - Continue olmesartan  20 mg daily. - Send prescriptions for a 90-day supply to pharmacy. - Recheck blood pressure in 4-8 weeks. - Monitor blood pressure at home twice a week. - Consider increasing medication or adding another agent if blood pressure remains elevated in 4-8

## 2024-02-02 NOTE — Assessment & Plan Note (Addendum)
 Secondary amenorrhea over a year. Normal hormone tests with low normal progesterone and ultrasound. Prefers gynecologist consultation before starting hormone therapy. - Provide OBGYN contact information in discharge summary to call for an appointment. - Discuss option of progesterone-only hormonal therapy after gynecological consultation.

## 2024-02-02 NOTE — Patient Instructions (Addendum)
 Please call [facility] and your Gynecologist for follow up appointments.  [facility] [address] [place], [place] [address] [phone]   Referral sent to: [facility] [address],  [place], [place] [address] Phone: [phone]

## 2024-02-02 NOTE — Progress Notes (Signed)
    SUBJECTIVE:   CHIEF COMPLAINT / HPI:  Discussed the use of AI scribe software for clinical note transcription with the patient, who gave verbal consent to proceed.  History of Present Illness   Paula Drake is a 33 year old female with hypertension who presents for a follow-up visit.  She has experienced an improvement in her blood pressure since starting olmesartan  and has not had any palpitations since beginning this medication. She monitors her blood pressure at home twice a week and notes that the diastolic pressure fluctuates. Her current medications include amlodipine  10 mg daily, hydrochlorothiazide  25 mg daily, olmesartan  20 mg daily, and Zoloft  25 mg daily. She also takes hydroxyzine  as needed and vitamin D  weekly. She is in the process of changing jobs and has a waiting period for insurance, so she requests a 90-day supply of her medications.  She has not had a menstrual period for over a year. She reports that previous hormone tests were done and that she had an ultrasound in June 2025; she recalls being told there were no abnormal findings. She reports that her Pap test was done in 2022. She is not currently on birth control and is considering options to address her amenorrhea.  No chest pain, difficulty breathing, or side effects from her medications.      PERTINENT  PMH / PSH: PMHx reviewed   OBJECTIVE:   BP (!) 114/94   Pulse 93   Wt (!) 407 lb 9.6 oz (184.9 kg)   LMP  (LMP Unknown)   BMI 74.55 kg/m   Physical Exam   VITALS: P- 93, BP- 114/94 CARDIOVASCULAR: Heart regular rate and rhythm, normal S1, S2, no murmurs. ABDOMEN: Abdomen non-distended, normal bowel sounds, non-tender. GENITOURINARY: External vagina without lesions or discharge. Cervix without lesions. EXTREMITIES: No leg swelling.     PULM: Air entry equal and CTA B/:   Results   RADIOLOGY Pelvic ultrasound: Normal, no abnormal finding in uterus, no fibroid, no mass (09/2023)  PATHOLOGY Pap  smear: Done in 2022, no malignancy  PROCEDURE Pap test Description: Speculum inserted. Cervix appears normal. Presence of scant discharge without symptoms.     ASSESSMENT/PLAN:   Assessment & Plan Essential hypertension Home readings improved with olmesartan . Office readings variable but improved. - Continue amlodipine  10 mg daily. - Continue hydrochlorothiazide  25 mg daily. - Continue olmesartan  20 mg daily. - Send prescriptions for a 90-day supply to pharmacy. - Recheck blood pressure in 4-8 weeks. - Monitor blood pressure at home twice a week. - Consider increasing medication or adding another agent if blood pressure remains elevated in 4-8  Cervical cancer screening PAP completed Secondary amenorrhea Secondary amenorrhea over a year. Normal hormone tests with low normal progesterone and ultrasound. Prefers gynecologist consultation before starting hormone therapy. - Provide OBGYN contact information in discharge summary to call for an appointment. - Discuss option of progesterone-only hormonal therapy after gynecological consultation.     Otto Fairly, MD [facility]

## 2024-02-03 ENCOUNTER — Other Ambulatory Visit: Payer: Self-pay

## 2024-02-03 ENCOUNTER — Ambulatory Visit: Payer: Self-pay | Admitting: Family Medicine

## 2024-02-03 ENCOUNTER — Other Ambulatory Visit: Payer: Self-pay | Admitting: Family Medicine

## 2024-02-03 DIAGNOSIS — I10 Essential (primary) hypertension: Secondary | ICD-10-CM

## 2024-02-03 LAB — CYTOLOGY - PAP
Comment: NEGATIVE
Diagnosis: NEGATIVE
High risk HPV: NEGATIVE

## 2024-02-03 MED ORDER — AMLODIPINE BESYLATE 10 MG PO TABS: 10.0000 mg | ORAL_TABLET | Freq: Every day | ORAL | 1 refills | Status: DC

## 2024-02-03 MED ORDER — HYDROCHLOROTHIAZIDE 25 MG PO TABS: 25.0000 mg | ORAL_TABLET | Freq: Every day | ORAL | 1 refills | Status: DC

## 2024-02-03 MED ORDER — MEDROXYPROGESTERONE ACETATE 10 MG PO TABS: 10.0000 mg | ORAL_TABLET | Freq: Every day | ORAL | 0 refills | Status: DC

## 2024-02-03 NOTE — Telephone Encounter (Signed)
 Called [facility] and cancelled prescriptions for hydrochlorothiazide  and amlodipine .   Resent to [facility] pharmacy per patient request.   Paula JAYSON English, RN

## 2024-02-04 ENCOUNTER — Other Ambulatory Visit: Payer: Self-pay | Admitting: Family Medicine

## 2024-02-04 MED ORDER — METFORMIN HCL 500 MG PO TABS: ORAL_TABLET | ORAL | 0 refills | Status: DC

## 2024-02-05 ENCOUNTER — Encounter: Payer: Self-pay | Admitting: Family Medicine

## 2024-02-05 DIAGNOSIS — I10 Essential (primary) hypertension: Secondary | ICD-10-CM

## 2024-02-05 MED ORDER — OLMESARTAN MEDOXOMIL 20 MG PO TABS: 20.0000 mg | ORAL_TABLET | Freq: Every day | ORAL | 1 refills | Status: DC

## 2024-02-05 NOTE — Telephone Encounter (Signed)
 Called [facility] and cancelled out prescription.   Please resend to [facility].

## 2024-02-25 ENCOUNTER — Other Ambulatory Visit: Payer: Self-pay | Admitting: Family Medicine

## 2024-02-25 DIAGNOSIS — I10 Essential (primary) hypertension: Secondary | ICD-10-CM

## 2024-03-01 ENCOUNTER — Other Ambulatory Visit: Payer: Self-pay | Admitting: Family Medicine

## 2024-03-03 ENCOUNTER — Ambulatory Visit: Payer: Self-pay | Admitting: Family Medicine

## 2024-03-07 ENCOUNTER — Ambulatory Visit: Payer: Self-pay | Admitting: Family Medicine

## 2024-03-21 ENCOUNTER — Ambulatory Visit: Payer: Self-pay | Admitting: Student

## 2024-03-21 VITALS — BP 128/85 | HR 123 | Ht 62.0 in | Wt >= 6400 oz

## 2024-03-21 DIAGNOSIS — G8929 Other chronic pain: Secondary | ICD-10-CM

## 2024-03-21 DIAGNOSIS — R519 Headache, unspecified: Secondary | ICD-10-CM

## 2024-03-21 NOTE — Patient Instructions (Signed)
 Pleasure to meet you today.  For your headache I ordered a head CT imaging which you can get at [facility] imaging on [address] [place].  I recommend continued use of Tylenol  or ibuprofen for the headache.  I have also ordered a sleep study to investigate for sleep apnea.

## 2024-03-21 NOTE — Progress Notes (Cosign Needed Addendum)
    SUBJECTIVE:   CHIEF COMPLAINT / HPI:   The patient presents with a persistent headache and blurry vision.  She has experienced a persistent, unilateral headache for the past week to week and a half, worsening in the morning. Blurry vision accompanies the headache, improving with eye drops. Puffiness in her hands and face is noted, especially in the morning, with tightness in her hands. She experiences a lack of appetite, which she attributes to metformin  use. There is no nausea or vomiting, but she strains to urinate since these symptoms began.  She is on metformin  for suspected PCOS due to amenorrhea and takes medication for hypertension, which has been elevated at home. Heart palpitations have improved with increased blood pressure medication. Snoring is reported during sleep, with no observed apnea. She denies recent head trauma and is not on birth control.  PERTINENT  PMH / PSH: Reviewed  OBJECTIVE:   BP 128/85   Pulse (!) 123   Ht 5' 2 (1.575 m)   Wt (!) 407 lb 9.6 oz (184.9 kg)   SpO2 100%   BMI 74.55 kg/m    Physical Exam General: Alert, morbidly obese, NAD, Cardiovascular: Regular, tachycardic no Murmurs, Normal S2/S2 Respiratory: CTAB, No wheezing or Rales Abdomen: No distension or tenderness Extremities: No edema on extremities   Neuro: Cranial 2-12 intact, no focal neurodeficits.  ASSESSMENT/PLAN:   Persistent headache  Headache persisting for 1-1.5 weeks, worse in the morning, with transient blurry vision. Differential includes idiopathic intracranial hypertension (IIH), obstructive sleep apnea, migraine type headache. No red flag symptoms.  - Ordered stat head CT scan to evaluate for intracranial pathology. - Recommended continued use of Tylenol  or ibuprofen for headache management - Placed referral to pulmonology for sleep study. - Strict return/ED precautions discussed with patient.  Tachycardia Patient denies any chest pain, SOB. Chart review show  intermittent tachycardia. Patient advised to check daily home vitals including BP and Pulse. If persistently high will return for evaluation. ED precautions discussed.   Norleen April, MD [facility]

## 2024-03-29 ENCOUNTER — Ambulatory Visit: Payer: Self-pay | Admitting: Family Medicine

## 2024-03-31 ENCOUNTER — Encounter: Payer: Self-pay | Admitting: Family Medicine

## 2024-03-31 NOTE — Telephone Encounter (Signed)
 Called patient to schedule appt (12/2 @9 :30) patient stated that she has taken the Miralax and it didn't work for her. Patient is asking if its something else that you can recommend.

## 2024-04-01 ENCOUNTER — Encounter: Payer: Self-pay | Admitting: Family Medicine

## 2024-04-01 ENCOUNTER — Ambulatory Visit: Payer: Self-pay | Admitting: Family Medicine

## 2024-04-01 VITALS — BP 124/91 | HR 108 | Ht 62.0 in | Wt >= 6400 oz

## 2024-04-01 DIAGNOSIS — R519 Headache, unspecified: Secondary | ICD-10-CM

## 2024-04-01 DIAGNOSIS — I10 Essential (primary) hypertension: Secondary | ICD-10-CM

## 2024-04-01 DIAGNOSIS — K5900 Constipation, unspecified: Secondary | ICD-10-CM

## 2024-04-01 NOTE — Assessment & Plan Note (Addendum)
 Elevated home diastolic readings. In-office readings slightly elevated. Current regimen includes amlodipine , hydrochlorothiazide , and amosartan. - Rechecked blood pressure in office with slight improvement. - Continue current antihypertensive regimen. - Monitor blood pressure at home.

## 2024-04-01 NOTE — Progress Notes (Signed)
 SUBJECTIVE:   CHIEF COMPLAINT / HPI:  Discussed the use of AI scribe software for clinical note transcription with the patient, who gave verbal consent to proceed.  History of Present Illness   Paula Drake is a 33 year old female who presents with constipation.  She has been experiencing constipation for approximately one week, starting the day before Thanksgiving. Her usual bowel movements are described as 'soft and round,' but they have recently become 'thin and hard.' She has been unable to have a bowel movement without the use of laxatives. Initially, she did not use Miralax as it typically does not work for her, opting instead for milk of magnesia, which she took at 40 AM the previous day, resulting in a bowel movement approximately two hours later. Despite this, she continues to feel bloated and 'full.' She denies abdominal pain or vomiting but did feel a little sick last night and almost needed to throw up. No recent blood in her stool, although she did see a small blood in her stool 1-2weeks ago, which she thought might be related to metformin  use. She denies straining at that time but notes that she is straining now. No blood in her stool since then.  Her current medications include amlodipine  10 mg daily, hydrochlorothiazide  25 mg daily, Benicar  20 mg daily, metformin  500 mg twice daily, vitamin D , and Zoloft  25 mg daily. She is not currently taking progesterone (Provera ).  She reports her home blood pressure readings are elevated, typically around 140/90 or 89 at night, although they are lower during office visits. She has a history of headaches and was advised to get a CT scan, but her insurance does not start until January 4th due to a recent job change. She has been experiencing headaches for her whole life but has never had a scan. She has an upcoming appointment with a sleep specialist for suspected sleep apnea.  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        PERTINENT  PMH / PSH: PMHx reviewed  OBJECTIVE:   BP (!) 124/91   Pulse (!) 108   Ht 5' 2 (1.575 m)   Wt (!) 411 lb 9.6 oz (186.7 kg)   LMP  (LMP Unknown)   SpO2 97%   BMI 75.28 kg/m   Physical Exam   VITALS: BP- 124/91 CHEST: Lungs clear to auscultation, no wheezing. CARDIOVASCULAR: Regular heart rate, no murmurs. ABDOMEN: Normal bowel sounds, no abdominal tenderness.       ASSESSMENT/PLAN:   Assessment & Plan Acute constipation Constipation with thin stools and bloating likely due to dietary factors. Recent milk of magnesium use effective. - Provided information on increasing dietary fiber. - Advised use of milk of magnesium as needed. - Recommended Gas-X for bloating. - Instructed to send MyChart message if symptoms do not improve for potential addition of Senna. - Advised to report any consistent blood in stool. Essential hypertension Elevated home diastolic readings. In-office readings slightly elevated. Current regimen includes amlodipine , hydrochlorothiazide , and amosartan. - Rechecked blood pressure in office with slight improvement. - Continue current antihypertensive regimen. - Monitor blood pressure at home. Nonintractable headache, unspecified chronicity pattern, unspecified headache type Chronic migraines with recent exacerbation.  Intermittent and currently asymptomatic. Possible related to sleep apnea (Sleep study scheduled). CT scan delayed due to insurance. - Scheduled CT scan appointment. - Advised to go to ED if headache worsens or if nausea/vomiting  occurs. - Continue follow up with sleep specialist.     Otto Fairly, MD [facility]

## 2024-04-01 NOTE — Assessment & Plan Note (Addendum)
 Chronic migraines with recent exacerbation.  Intermittent and currently asymptomatic. Possible related to sleep apnea (Sleep study scheduled). CT scan delayed due to insurance. - Scheduled CT scan appointment. - Advised to go to ED if headache worsens or if nausea/vomiting occurs. - Continue follow up with sleep specialist.

## 2024-04-01 NOTE — Patient Instructions (Signed)

## 2024-04-01 NOTE — Progress Notes (Signed)
 I reached out to GI to see how much it would be for a CT head w/o contrast, Rep stated that it would be $560. I contacted the patient to inform her on how much it would cost. Patient stated she will wait until she her insurance kicks in.

## 2024-04-07 ENCOUNTER — Other Ambulatory Visit: Payer: Self-pay | Admitting: Family Medicine

## 2024-04-07 MED ORDER — SENNA 8.6 MG PO CAPS: 1.0000 | ORAL_CAPSULE | Freq: Two times a day (BID) | ORAL | 0 refills | Status: AC

## 2024-04-07 MED ORDER — POLYETHYLENE GLYCOL 3350 17 GM/SCOOP PO POWD: 17.0000 g | Freq: Every day | ORAL | 1 refills | Status: AC | PRN

## 2024-04-08 ENCOUNTER — Ambulatory Visit: Payer: Self-pay | Admitting: Family Medicine

## 2024-04-08 NOTE — Telephone Encounter (Signed)
 Patient is coming in on 12/19 @830  am

## 2024-04-13 ENCOUNTER — Encounter: Payer: Self-pay | Admitting: Family Medicine

## 2024-04-15 ENCOUNTER — Encounter: Payer: Self-pay | Admitting: Family Medicine

## 2024-04-15 ENCOUNTER — Ambulatory Visit: Payer: Self-pay | Admitting: Family Medicine

## 2024-04-15 VITALS — BP 130/79 | HR 85 | Ht 62.0 in | Wt >= 6400 oz

## 2024-04-15 DIAGNOSIS — I10 Essential (primary) hypertension: Secondary | ICD-10-CM

## 2024-04-15 DIAGNOSIS — K5900 Constipation, unspecified: Secondary | ICD-10-CM

## 2024-04-15 NOTE — Assessment & Plan Note (Signed)
 Lost 7 lbs in 2 weeks due to poor oral intake Need to keep a close eye on this As discussed with her, we don't want rapid weight loss Labt obtained today - Bmet, Mag Monitor closely for now.

## 2024-04-15 NOTE — Assessment & Plan Note (Signed)
Well-controlled on her current regimen. 

## 2024-04-15 NOTE — Patient Instructions (Signed)

## 2024-04-15 NOTE — Progress Notes (Signed)
" ° ° °  SUBJECTIVE:   CHIEF COMPLAINT / HPI:   Constipation: Constipation persists for > 4 weeks. She denies abdominal pain. The stool is soft but difficult to get out. Her last BM was this morning. No blood in her stool. She completed bowel clearance regimen with Senna 1 tablet BID for 1 week, with Miralax  1 cap daily for  1 week, and then PRN. She stopped her Milk of Magnesium and is now on Miralax  prn, which is still not helpful. Bowel regimen did not help. She feels better with Milk of Magnesium.    HTN:She is compliant with Nifedipine 10 mg Q, HCTZ 25 mg QD, and Benicar  20 mg QD. No concern.    Weight: Poor oral intake given constipation. No excessive exercise.  PERTINENT  PMH / PSH: PMhx reviewed  OBJECTIVE:   BP 130/79   Pulse 85   Ht 5' 2 (1.575 m)   Wt (!) 404 lb 8 oz (183.5 kg)   LMP  (LMP Unknown) Comment: HAVENT HAD A PERIOD IN OVER A YEAR  SpO2 99%   BMI 73.98 kg/m   Physical Exam Vitals and nursing note reviewed.  Constitutional:      Appearance: Normal appearance. She is obese. She is not ill-appearing.  Cardiovascular:     Rate and Rhythm: Normal rate and regular rhythm.     Heart sounds: Normal heart sounds. No murmur heard. Pulmonary:     Effort: Pulmonary effort is normal. No respiratory distress.     Breath sounds: Normal breath sounds. No wheezing.  Abdominal:     General: Bowel sounds are normal. There is no distension.     Palpations: Abdomen is soft. There is no mass.     Tenderness: There is no abdominal tenderness.     Comments: Abdomen obese.       ASSESSMENT/PLAN:   Assessment & Plan Constipation, unspecified constipation type Poor response to bowel regimen Lab ordered to assess for metabolic disorder - TSH, Cmet, Mag checked.  DG Abd ordered - advised to go to radiology anytime for testing. Resume Milk of manesium prn alternating with Miralax  prn F/U soon if there is no improvement She agreed with the plan Morbid obesity (HCC) Lost 7  lbs in 2 weeks due to poor oral intake Need to keep a close eye on this As discussed with her, we don't want rapid weight loss Labt obtained today - Bmet, Mag Monitor closely for now. Essential hypertension Well controlled on her current regimen     Otto Fairly, MD [facility]  "

## 2024-04-16 ENCOUNTER — Ambulatory Visit: Payer: Self-pay | Admitting: Family Medicine

## 2024-04-16 LAB — CMP14+EGFR
ALT: 6 IU/L (ref 0–32)
AST: 14 IU/L (ref 0–40)
Albumin: 3.9 g/dL (ref 3.9–4.9)
Alkaline Phosphatase: 76 IU/L (ref 41–116)
BUN/Creatinine Ratio: 14 (ref 9–23)
BUN: 12 mg/dL (ref 6–20)
Bilirubin Total: 0.3 mg/dL (ref 0.0–1.2)
CO2: 24 mmol/L (ref 20–29)
Calcium: 9.6 mg/dL (ref 8.7–10.2)
Chloride: 100 mmol/L (ref 96–106)
Creatinine, Ser: 0.84 mg/dL (ref 0.57–1.00)
Globulin, Total: 3 g/dL (ref 1.5–4.5)
Glucose: 93 mg/dL (ref 70–99)
Potassium: 4 mmol/L (ref 3.5–5.2)
Sodium: 138 mmol/L (ref 134–144)
Total Protein: 6.9 g/dL (ref 6.0–8.5)
eGFR: 95 mL/min/1.73

## 2024-04-16 LAB — TSH RFX ON ABNORMAL TO FREE T4: TSH: 1.78 u[IU]/mL (ref 0.450–4.500)

## 2024-04-16 LAB — MAGNESIUM: Magnesium: 1.8 mg/dL (ref 1.6–2.3)

## 2024-04-22 ENCOUNTER — Encounter (HOSPITAL_BASED_OUTPATIENT_CLINIC_OR_DEPARTMENT_OTHER): Payer: Self-pay | Admitting: Emergency Medicine

## 2024-04-22 ENCOUNTER — Other Ambulatory Visit: Payer: Self-pay

## 2024-04-22 ENCOUNTER — Emergency Department (HOSPITAL_BASED_OUTPATIENT_CLINIC_OR_DEPARTMENT_OTHER)
Admission: EM | Admit: 2024-04-22 | Discharge: 2024-04-22 | Disposition: A | Discharge status: Home / Self Care | Payer: Self-pay | Attending: Emergency Medicine | Admitting: Emergency Medicine

## 2024-04-22 ENCOUNTER — Emergency Department (HOSPITAL_BASED_OUTPATIENT_CLINIC_OR_DEPARTMENT_OTHER): Payer: Self-pay

## 2024-04-22 DIAGNOSIS — R10817 Generalized abdominal tenderness: Secondary | ICD-10-CM | POA: Insufficient documentation

## 2024-04-22 DIAGNOSIS — R1085 Abdominal pain of multiple sites: Secondary | ICD-10-CM | POA: Insufficient documentation

## 2024-04-22 DIAGNOSIS — E876 Hypokalemia: Secondary | ICD-10-CM | POA: Insufficient documentation

## 2024-04-22 LAB — COMPREHENSIVE METABOLIC PANEL WITH GFR
ALT: 10 U/L (ref 0–44)
AST: 25 U/L (ref 15–41)
Albumin: 4 g/dL (ref 3.5–5.0)
Alkaline Phosphatase: 111 U/L (ref 38–126)
Anion gap: 10 (ref 5–15)
BUN: 17 mg/dL (ref 6–20)
CO2: 29 mmol/L (ref 22–32)
Calcium: 9.6 mg/dL (ref 8.9–10.3)
Chloride: 102 mmol/L (ref 98–111)
Creatinine, Ser: 0.76 mg/dL (ref 0.44–1.00)
GFR, Estimated: >60 mL/min
Glucose, Bld: 113 mg/dL — ABNORMAL HIGH (ref 70–99)
Potassium: 3.4 mmol/L — ABNORMAL LOW (ref 3.5–5.1)
Sodium: 140 mmol/L (ref 135–145)
Total Bilirubin: 0.2 mg/dL (ref 0.0–1.2)
Total Protein: 7.3 g/dL (ref 6.5–8.1)

## 2024-04-22 LAB — HCG, SERUM, QUALITATIVE: Preg, Serum: NEGATIVE

## 2024-04-22 LAB — CBC
HCT: 37 % (ref 36.0–46.0)
Hemoglobin: 12.1 g/dL (ref 12.0–15.0)
MCH: 28.2 pg (ref 26.0–34.0)
MCHC: 32.7 g/dL (ref 30.0–36.0)
MCV: 86.2 fL (ref 80.0–100.0)
Platelets: 250 K/uL (ref 150–400)
RBC: 4.29 MIL/uL (ref 3.87–5.11)
RDW: 13.7 % (ref 11.5–15.5)
WBC: 8.8 K/uL (ref 4.0–10.5)
nRBC: 0 % (ref 0.0–0.2)

## 2024-04-22 LAB — LIPASE, BLOOD: Lipase: 30 U/L (ref 11–51)

## 2024-04-22 MED ORDER — IOHEXOL 350 MG/ML SOLN
100.0000 mL | Freq: Once | INTRAVENOUS | Status: AC | PRN
  Administered 2024-04-22: 100 mL via INTRAVENOUS

## 2024-04-22 MED ORDER — HYOSCYAMINE SULFATE 0.125 MG SL SUBL
0.2500 mg | SUBLINGUAL_TABLET | Freq: Once | SUBLINGUAL | Status: AC
  Administered 2024-04-22: 0.25 mg via SUBLINGUAL
  Filled 2024-04-22: qty 2

## 2024-04-22 MED ORDER — HYOSCYAMINE SULFATE 0.125 MG SL SUBL: 0.1250 mg | SUBLINGUAL_TABLET | SUBLINGUAL | 0 refills | Status: AC | PRN

## 2024-04-22 NOTE — Discharge Instructions (Signed)
 The CAT scan today did not show any significant constipation.  Hold off on any laxatives.  Your pain is likely from your intestines spasming, I will give you a medication to use for abdominal pain and spasm.  Schedule follow-up with gastroenterology listed above.

## 2024-04-22 NOTE — ED Triage Notes (Signed)
 Pt c/o abdominal pain and inability to have a BM without taking a laxative x 2 days. Pt's pcp wanted her to have xray done but the pt states that she has not done that yet.

## 2024-04-22 NOTE — ED Provider Notes (Signed)
 " [place] EMERGENCY DEPARTMENT AT [facility] Provider Note   CSN: [id number] Arrival date & time: 04/22/24  0410     Patient presents with: Abdominal Pain   Paula Drake is a 33 y.o. female.   Presents to the emergency department for evaluation of abdominal pain and constipation.  Patient reports that symptoms have been ongoing for more than a week.  She saw her doctor and had lab work done that was normal.  Her doctor was going to send her for an x-ray but it has not happened yet.  Patient reports for the last 2 days her symptoms have worsened, having diffuse mid abdominal pain.  She has not had any vomiting or fever.       Prior to Admission medications  Medication Sig Start Date End Date Taking? Authorizing Provider  hyoscyamine  (LEVSIN  SL) 0.125 MG SL tablet Place 1 tablet (0.125 mg total) under the tongue every 4 (four) hours as needed for cramping (abdominal pain). 04/22/24  Yes Armonte Tortorella, Lonni PARAS, MD  amLODipine  (NORVASC ) 10 MG tablet Take 1 tablet (10 mg total) by mouth at bedtime. 02/03/24   Anders Otto DASEN, MD  hydrochlorothiazide  (HYDRODIURIL ) 25 MG tablet Take 1 tablet (25 mg total) by mouth daily. 02/03/24   Anders Otto DASEN, MD  hydrOXYzine  (ATARAX ) 10 MG tablet Take 0.5-1 tablets (5-10 mg total) by mouth 3 (three) times daily as needed for anxiety. 12/14/23   McDiarmid, Krystal BIRCH, MD  metFORMIN  (GLUCOPHAGE ) 500 MG tablet Take 1 tablet (500 mg total) by mouth 2 (two) times daily with a meal. 03/01/24   Anders Otto DASEN, MD  olmesartan  (BENICAR ) 20 MG tablet Take 1 tablet (20 mg total) by mouth at bedtime. 02/05/24   Anders Otto DASEN, MD  polyethylene glycol powder (GLYCOLAX /MIRALAX ) 17 GM/SCOOP powder Take 17 g by mouth daily as needed. Dissolve 1 capful (17g) in 4-8 ounces of liquid and take by mouth daily. 04/07/24   Anders Otto DASEN, MD  sertraline  (ZOLOFT ) 50 MG tablet Take 0.5-1 tablets (25-50 mg total) by mouth daily. 12/14/23   McDiarmid, Krystal BIRCH, MD   Vitamin D , Ergocalciferol , 50000 units CAPS Take 1 tablet by mouth once a week. 10/16/23   McDiarmid, Krystal BIRCH, MD    Allergies: Patient has no known allergies.    Review of Systems  Updated Vital Signs BP (!) 160/98   Pulse 84   Temp 98.1 F (36.7 C)   Resp 20   LMP  (LMP Unknown) Comment: HAVENT HAD A PERIOD IN OVER A YEAR  SpO2 100%   Physical Exam Vitals and nursing note reviewed.  Constitutional:      General: She is not in acute distress.    Appearance: She is well-developed. She is obese.  HENT:     Head: Normocephalic and atraumatic.     Mouth/Throat:     Mouth: Mucous membranes are moist.  Eyes:     General: Vision grossly intact. Gaze aligned appropriately.     Extraocular Movements: Extraocular movements intact.     Conjunctiva/sclera: Conjunctivae normal.  Cardiovascular:     Rate and Rhythm: Normal rate and regular rhythm.     Pulses: Normal pulses.     Heart sounds: Normal heart sounds, S1 normal and S2 normal. No murmur heard.    No friction rub. No gallop.  Pulmonary:     Effort: Pulmonary effort is normal. No respiratory distress.     Breath sounds: Normal breath sounds.  Abdominal:  General: Bowel sounds are normal.     Palpations: Abdomen is soft.     Tenderness: There is generalized abdominal tenderness. There is no guarding or rebound.     Hernia: No hernia is present.  Musculoskeletal:        General: No swelling.     Cervical back: Full passive range of motion without pain, normal range of motion and neck supple. No spinous process tenderness or muscular tenderness. Normal range of motion.     Right lower leg: No edema.     Left lower leg: No edema.  Skin:    General: Skin is warm and dry.     Capillary Refill: Capillary refill takes less than 2 seconds.     Findings: No ecchymosis, erythema, rash or wound.  Neurological:     General: No focal deficit present.     Mental Status: She is alert and oriented to person, place, and time.      GCS: GCS eye subscore is 4. GCS verbal subscore is 5. GCS motor subscore is 6.     Cranial Nerves: Cranial nerves 2-12 are intact.     Sensory: Sensation is intact.     Motor: Motor function is intact.     Coordination: Coordination is intact.  Psychiatric:        Attention and Perception: Attention normal.        Mood and Affect: Mood normal.        Speech: Speech normal.        Behavior: Behavior normal.     (all labs ordered are listed, but only abnormal results are displayed) Labs Reviewed  COMPREHENSIVE METABOLIC PANEL WITH GFR - Abnormal; Notable for the following components:      Result Value   Potassium 3.4 (*)    Glucose, Bld 113 (*)    All other components within normal limits  LIPASE, BLOOD  CBC  HCG, SERUM, QUALITATIVE  URINALYSIS, ROUTINE W REFLEX MICROSCOPIC    EKG: None  Radiology: CT ABDOMEN PELVIS W CONTRAST Result Date: 04/22/2024 EXAM: CT ABDOMEN AND PELVIS WITH CONTRAST 04/22/2024 06:21:34 AM TECHNIQUE: CT of the abdomen and pelvis was performed with the administration of iohexol  (OMNIPAQUE ) 350 MG/ML injection. Multiplanar reformatted images are provided for review. Automated exposure control, iterative reconstruction, and/or weight-based adjustment of the mA/kV was utilized to reduce the radiation dose to as low as reasonably achievable. COMPARISON: Pelvis ultrasound 10/16/2023. CLINICAL HISTORY: 33 year old female. Abdominal pain, acute, nonlocalized. FINDINGS: LOWER CHEST: Large body habitus, otherwise normal visible lower chest. LIVER: The liver is unremarkable. GALLBLADDER AND BILE DUCTS: Diminutive gallbladder appears normal. No biliary ductal dilatation. SPLEEN: No acute abnormality. PANCREAS: No acute abnormality. ADRENAL GLANDS: No acute abnormality. KIDNEYS, URETERS AND BLADDER: No stones in the kidneys or ureters. No hydronephrosis. No perinephric or periureteral stranding. Diminutive bladder appears normal. GI AND BOWEL: Diminutive stomach and  duodenum also. No dilated bowel loops. Decompressed large bowel from the descending colon distally. Both the right colon and transverse colon contain fluid. No focal large bowel wall thickening. Gas containing appendix appears normal tracking to the midline and then into the right hemipelvis. See coronal images 70 through 80. Most small bowel loops are fully decompressed. No mesenteric inflammation identified. PERITONEUM AND RETROPERITONEUM: No ascites. No free air. VASCULATURE: Suboptimal intravascular contrast bolus. Grossly patent major arterial and portal venous structures in the abdomen and pelvis. Aorta is normal in caliber. LYMPH NODES: No lymphadenopathy. REPRODUCTIVE ORGANS: No acute abnormality. 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 No acute osseous abnormality. No focal soft tissue abnormality. IMPRESSION: 1. Normal appendix and decompressed bowel throughout the abdomen and pelvis. No significant retained stool, but some fluid in the large bowel raises the possibility of diarrhea. 2. No other acute or inflammatory process in the abdomen and pelvis. Electronically signed by: Helayne Hurst MD 04/22/2024 06:40 AM EST RP Workstation: [id number]     Procedures   Medications Ordered in the ED  hyoscyamine  (LEVSIN  SL) SL tablet 0.25 mg (has no administration in time range)  iohexol  (OMNIPAQUE ) 350 MG/ML injection 100 mL (100 mLs Intravenous Contrast Given 04/22/24 0612)                                    Medical Decision Making Amount and/or Complexity of Data Reviewed Labs: ordered. Decision-making details documented in ED Course. Radiology: ordered and independent interpretation performed. Decision-making details documented in ED Course.  Risk Prescription drug management.   Differential Diagnosis considered includes, but not limited to: Cholelithiasis; cholecystitis; cholangitis; bowel obstruction; esophagitis;  gastritis; peptic ulcer disease; pancreatitis; cardiac.  Presents to the Emergency Department for evaluation of abdominal pain with constipation.  Patient reports that symptoms are unusual, been present for about a week.  She has been using laxatives to have a bowel movement.  Patient's lab work is unremarkable.  I did perform CT scan to further evaluate.  She does not have any evidence of appendicitis, appendix is normal.  Patient's colon is largely decompressed and there is no sign of obstruction.  No significant retained stool.  Suspect she is having some pain secondary to laxative use without persistent constipation.  Will try Levsin , patient likely needs GI follow-up but no emergent condition present currently.     Final diagnoses:  Abdominal pain of multiple sites    ED Discharge Orders          Ordered    hyoscyamine  (LEVSIN  SL) 0.125 MG SL tablet  Every 4 hours PRN        04/22/24 0714               Haze Lonni PARAS, MD 04/22/24 [phone]  "

## 2024-05-03 ENCOUNTER — Ambulatory Visit (INDEPENDENT_AMBULATORY_CARE_PROVIDER_SITE_OTHER): Payer: Self-pay | Admitting: Family Medicine

## 2024-05-03 ENCOUNTER — Encounter: Payer: Self-pay | Admitting: Family Medicine

## 2024-05-03 VITALS — BP 130/95 | HR 102 | Ht 62.0 in | Wt >= 6400 oz

## 2024-05-03 DIAGNOSIS — R109 Unspecified abdominal pain: Secondary | ICD-10-CM

## 2024-05-03 DIAGNOSIS — E876 Hypokalemia: Secondary | ICD-10-CM

## 2024-05-03 DIAGNOSIS — K5909 Other constipation: Secondary | ICD-10-CM

## 2024-05-03 DIAGNOSIS — I10 Essential (primary) hypertension: Secondary | ICD-10-CM

## 2024-05-03 MED ORDER — OLMESARTAN MEDOXOMIL-HCTZ 40-25 MG PO TABS: 1.0000 | ORAL_TABLET | Freq: Every day | ORAL | 1 refills | Status: DC

## 2024-05-03 NOTE — Progress Notes (Signed)
 "   SUBJECTIVE:   CHIEF COMPLAINT / HPI:   Discussed the use of AI scribe software for clinical note transcription with the patient, who gave verbal consent to proceed.  History of Present Illness   Paula Drake is a 34 year old female who presents with persistent abdominal pain and constipation.  She experiences persistent constipation and abdominal pain, characterized by a sore ache in the lower abdomen. A CT scan at the hospital showed a normal appendix and no significant retained stool, but some fluid in the large bowel. She was prescribed hyoscyamine  for pain, which has been helpful.  Her bowel movements are infrequent, occurring every three to four days without laxatives, and when they occur, the stool is small and hard. She notes that her stomach no longer growls and she has difficulty using the bathroom without laxatives.  She experiences night sweats, with wet patches on her chest. No nausea, vomiting, or blood in the stool.  Her current medications include amlodipine  10 mg daily, hydrochlorothiazide  25 mg, Atarax  as needed for anxiety, hyoscyamine  0.125 mg every four hours as needed, metformin  500 mg twice daily, Benicar  20 mg daily, Miralax  as needed, Zoloft  50 mg daily, and Vitamin D . She reports inconsistent use of metformin  due to lack of appetite.  A low potassium level of 3.4 was noted during her hospital visit. She consumes fruits and vegetables regularly. She expresses concern about her diet, mentioning regular consumption of vegetables and fruits, but hesitates to eat due to constipation issues.       PERTINENT  PMH / PSH: PMhx reviewed  OBJECTIVE:   BP (!) 130/95   Pulse (!) 102   Ht 5' 2 (1.575 m)   Wt (!) 406 lb 6.4 oz (184.3 kg)   LMP  (LMP Unknown) Comment: HAVENT HAD A PERIOD IN OVER A YEAR  SpO2 97%   BMI 74.33 kg/m   Physical Exam   VITALS: BP- 140/95 CHEST: Lungs clear to auscultation, no wheezing. CARDIOVASCULAR: Heart regular rate and rhythm, no  murmurs. ABDOMEN: Normal bowel sounds, no abdominal distention, non-tender abdomen. EXTREMITIES: No ankle swelling.      Results   Labs TSH (03/2024): Within normal limits Comprehensive metabolic panel (03/2024): Kidney function within normal limits, liver function within normal limits, potassium 3.4 mildly decreased  Radiology Abdominal and pelvic CT (04/22/2024): Appendix normal, no significant retained stool, fluid in large bowel possibly secondary to diarrhea, no inflammation, liver unremarkable, otherwise unremarkable findings        ASSESSMENT/PLAN:   Assessment & Plan Chronic constipation Chronic constipation with abdominal pain Abdominal exam benign CT scan normal. Pain managed with hyoscyamine . Potassium slightly low. Possible dietary or gastrointestinal causes. - Referred to gastroenterologist for further evaluation. - Continue hyoscyamine  0.125 mg every 4 hours as needed for abdominal pain. - Encouraged dietary intake of fruits and vegetables, especially bananas, to address hypokalemia. - Scheduled follow-up in 1-2 weeks to recheck potassium levels. Essential hypertension Blood pressure elevated at 140/95. Current regimen includes amlodipine  and hydrochlorothiazide . Benicar  not contributing to constipation. Plan to combine Benicar  with hydrochlorothiazide  for better management. - Discontinued hydrochlorothiazide  and separate Benicar . - Prescribed Benicar  HCTZ 40 mg/25 mg, one tablet daily. - Continue amlodipine  10 mg daily. - Monitor blood pressure at home. - Scheduled follow-up in one week for blood pressure check and lab tests. Hypokalemia Mild Hypokalemia Potassium level slightly low at 3.4. No supplementation given due to mild deficiency. - Encouraged dietary intake of potassium-rich foods such as bananas. - Scheduled follow-up in  1-2 weeks to recheck potassium levels.     Otto Fairly, MD [facility]  "

## 2024-05-03 NOTE — Assessment & Plan Note (Addendum)
 Chronic constipation with abdominal pain Abdominal exam benign CT scan normal. Pain managed with hyoscyamine . Potassium slightly low. Possible dietary or gastrointestinal causes. - Referred to gastroenterologist for further evaluation. - Continue hyoscyamine  0.125 mg every 4 hours as needed for abdominal pain. - Encouraged dietary intake of fruits and vegetables, especially bananas, to address hypokalemia. - Scheduled follow-up in 1-2 weeks to recheck potassium levels.

## 2024-05-03 NOTE — Patient Instructions (Addendum)
 Nice seeing you today. I have referred you to a gastroenterologist for your chronic constipation. Please schedule follow up with me in 1-2 weeks for repeat lab.  We changed your BP medication to Benicar -HCT combined 40 mg/25 mg 1 tablet daily. Discontinue Hydrochlorothiazide  and Benicar . Continue Amlodipine  10 mg daily and I will see you back in 1 week for BP check and lab. Your BP goal goal is <130/80.

## 2024-05-03 NOTE — Assessment & Plan Note (Addendum)
 Blood pressure elevated at 140/95. Current regimen includes amlodipine  and hydrochlorothiazide . Benicar  not contributing to constipation. Plan to combine Benicar  with hydrochlorothiazide  for better management. - Discontinued hydrochlorothiazide  and separate Benicar . - Prescribed Benicar  HCTZ 40 mg/25 mg, one tablet daily. - Continue amlodipine  10 mg daily. - Monitor blood pressure at home. - Scheduled follow-up in one week for blood pressure check and lab tests.

## 2024-05-09 ENCOUNTER — Ambulatory Visit: Payer: Self-pay | Admitting: Pulmonary Disease

## 2024-05-13 ENCOUNTER — Ambulatory Visit: Payer: Self-pay | Admitting: Family Medicine

## 2024-05-14 ENCOUNTER — Encounter: Payer: Self-pay | Admitting: Family Medicine

## 2024-05-16 MED ORDER — HYDROCHLOROTHIAZIDE 25 MG PO TABS: 25.0000 mg | ORAL_TABLET | Freq: Every day | ORAL | 0 refills | Status: DC

## 2024-05-16 MED ORDER — AMLODIPINE BESYLATE 10 MG PO TABS: 10.0000 mg | ORAL_TABLET | Freq: Every day | ORAL | 0 refills | Status: DC

## 2024-05-16 MED ORDER — OLMESARTAN MEDOXOMIL 40 MG PO TABS: 40.0000 mg | ORAL_TABLET | Freq: Every day | ORAL | 0 refills | Status: DC

## 2024-05-16 NOTE — Telephone Encounter (Signed)
 Spoke with Kamryn at her pharmacy and canceled Benicar -HCT and sent in new separate prescriptions per her requests for 20-day supply.

## 2024-05-23 ENCOUNTER — Encounter: Payer: Self-pay | Admitting: Family Medicine

## 2024-05-24 ENCOUNTER — Ambulatory Visit: Payer: Self-pay | Admitting: Family Medicine

## 2024-05-25 ENCOUNTER — Encounter: Payer: Self-pay | Admitting: Family Medicine

## 2024-05-26 ENCOUNTER — Ambulatory Visit: Payer: Self-pay | Admitting: Family Medicine

## 2024-05-26 ENCOUNTER — Encounter: Payer: Self-pay | Admitting: Family Medicine

## 2024-05-26 VITALS — BP 122/80 | HR 95 | Ht 63.0 in | Wt >= 6400 oz

## 2024-05-26 DIAGNOSIS — I10 Essential (primary) hypertension: Secondary | ICD-10-CM

## 2024-05-26 DIAGNOSIS — R040 Epistaxis: Secondary | ICD-10-CM

## 2024-05-26 DIAGNOSIS — G4489 Other headache syndrome: Secondary | ICD-10-CM

## 2024-05-26 DIAGNOSIS — R519 Headache, unspecified: Secondary | ICD-10-CM

## 2024-05-26 MED ORDER — KETOROLAC TROMETHAMINE 30 MG/ML IJ SOLN
30.0000 mg | Freq: Once | INTRAMUSCULAR | Status: AC
  Administered 2024-05-26: 30 mg via INTRAMUSCULAR

## 2024-05-26 MED ORDER — MAGNESIUM OXIDE (ELEMENTAL) 400 MG PO TABS: ORAL_TABLET | ORAL | 0 refills | Status: AC

## 2024-05-26 MED ORDER — IBUPROFEN 400 MG PO TABS: 400.0000 mg | ORAL_TABLET | Freq: Three times a day (TID) | ORAL | 0 refills | Status: AC | PRN

## 2024-05-26 NOTE — Assessment & Plan Note (Signed)
 Chronic headache recurrent Occipital headache, unresponsive to ibuprofen  and acetaminophen . BC powder provides temporary relief. Differential includes tension headache, migraine and possible sleep apnea contribution. - Administered Toradol  injection during this visit for acute relief. - Recommended magnesium  oxide 400 mg daily for 1 weeks, then as needed. - Scheduled CT scan of the head. - Advised against BC powder use. - Instructed to seek hospital care if headache worsens with nausea, vomiting, vision changes, or weakness. - Sent ibuprofen  prescription to pharmacy. May alternate with Tylenol  as needed

## 2024-05-26 NOTE — Telephone Encounter (Signed)
 Patient scheduled in [facility]erm for 9:50a.

## 2024-05-26 NOTE — Progress Notes (Signed)
 "   SUBJECTIVE:   CHIEF COMPLAINT / HPI:   Discussed the use of AI scribe software for clinical note transcription with the patient, who gave verbal consent to proceed.  History of Present Illness   Paula Drake is a 34 year old female with hypertension who presents with headaches and nosebleeds.  She has experienced headaches throughout her life, with the most recent episode beginning about a week ago. The headache is described as an aching pain located at the occipital region, rated as 9 out of 10 in severity. She has tried ibuprofen  200 mg (four tablets) and Tylenol  Extra Strength 600 mg (two tablets) without relief. The only effective relief comes from BC powder. She is awaiting a CT scan of her head, which was delayed due to insurance issues, and has a sleep study scheduled for February 26. No vision changes, nausea, or vomiting are associated with the headaches.  She reports epistaxis occurring every time she blows her nose, which resolves with pinching.  Her current medications include amlodipine  10 mg at bedtime, hydrochlorothiazide  25 mg daily at bedtime, metformin  500 mg twice daily, Benicar  40 mg daily at bedtime, Zoloft  50 mg daily, and weekly vitamin D  supplements. She takes all medications regularly. Her blood pressure at home was recorded at 138/104 two nights ago. She has a history of low potassium levels, which were last checked during an emergency visit.       PERTINENT  PMH / PSH: PMHx reviewed  OBJECTIVE:   Vitals:   05/26/24 0947 05/26/24 0958 05/26/24 1001  BP: 122/80 (!) 130/100 122/80  Pulse: 95    SpO2: 100%    Weight: (!) 406 lb 6.4 oz (184.3 kg)    Height: 5' 3 (1.6 m)      Physical Exam   VITALS: BP- 122/80 GEN: Well-appearing CHEST: Lungs clear to auscultation, no wheezing, no crackles. CARDIOVASCULAR: Heart regular rate and rhythm, no murmurs. NEUROLOGICAL: No facial asymmetry. No sensory loss. Motor strength 5/5 in upper extremities. Normal knee  jerk reflex. Neg Brudzinski sign.  Normal gait.       ASSESSMENT/PLAN:   Assessment & Plan Nonintractable headache, unspecified chronicity pattern, unspecified headache type Chronic headache recurrent Occipital headache, unresponsive to ibuprofen  and acetaminophen . BC powder provides temporary relief. Differential includes tension headache, migraine and possible sleep apnea contribution. No sign of intracranial HTN, although possible given her weight.  - Administered Toradol  injection during this visit for acute relief. - Recommended magnesium  oxide 400 mg daily for 1 weeks, then as needed. - Scheduled CT scan of the head. - Advised against BC powder use. - Instructed to seek hospital care if headache worsens with nausea, vomiting, vision changes, or weakness. - Sent ibuprofen  prescription to pharmacy. May alternate with Tylenol  as needed Essential hypertension Home blood pressure elevated; office reading normal. Blood pressure control important due to potential headache contribution. - Rechecked blood pressure. - Continue current antihypertensive regimen: amlodipine , hydrochlorothiazide , and Benicar . - Will adjust medication, if necessary, based on blood pressure readings. Epistaxis Intermittent nosebleeds due to nasal dryness, likely worsened by cold weather. Conservative management unless severe bleeding occurs. - Advised pinching nose to stop bleeding. - Instructed to avoid forceful nose blowing. - Recommended seeking hospital care if bleeding becomes severe. Other headache syndrome Chronic headache recurrent Occipital headache, unresponsive to ibuprofen  and acetaminophen . BC powder provides temporary relief. Differential includes tension headache, migraine and possible sleep apnea contribution. - Administered Toradol  injection during this visit for acute relief. - Recommended magnesium  oxide  400 mg daily for 1 weeks, then as needed. - Scheduled CT scan of the head. - Advised  against BC powder use. - Instructed to seek hospital care if headache worsens with nausea, vomiting, vision changes, or weakness. - Sent ibuprofen  prescription to pharmacy. May alternate with Tylenol  as needed     Otto Fairly, MD [facility]  "

## 2024-05-26 NOTE — Assessment & Plan Note (Addendum)
 Home blood pressure elevated; office reading normal. Blood pressure control important due to potential headache contribution. - Rechecked blood pressure. - Continue current antihypertensive regimen: amlodipine , hydrochlorothiazide , and Benicar . - Will adjust medication, if necessary, based on blood pressure readings.

## 2024-05-26 NOTE — Assessment & Plan Note (Addendum)
 Chronic headache recurrent Occipital headache, unresponsive to ibuprofen  and acetaminophen . BC powder provides temporary relief. Differential includes tension headache, migraine and possible sleep apnea contribution. No sign of intracranial HTN, although possible given her weight.  - Administered Toradol  injection during this visit for acute relief. - Recommended magnesium  oxide 400 mg daily for 1 weeks, then as needed. - Scheduled CT scan of the head. - Advised against BC powder use. - Instructed to seek hospital care if headache worsens with nausea, vomiting, vision changes, or weakness. - Sent ibuprofen  prescription to pharmacy. May alternate with Tylenol  as needed

## 2024-05-26 NOTE — Assessment & Plan Note (Signed)
 Home blood pressure elevated; office reading normal. Blood pressure control important due to potential headache contribution. - Rechecked blood pressure. - Continue current antihypertensive regimen: amlodipine , hydrochlorothiazide , and Benicar . - Will adjust medication, if necessary, based on blood pressure readings.

## 2024-05-26 NOTE — Patient Instructions (Addendum)
 It was nice seeing you today. I am sorry about your headache. You BP looks good. Hence, we will not change or adjust your medicine for now.  For the headache, this could be tension vs migraine headache. You neuro exam is reassuring. We will reorder your CT head. We gave you Toradol  injection today.Use Magnesium  oxide daily for 5-7 days and then as needed for headache. Also use Ibuprofen  or Tylenol  as needed for headache.  Please, go to the ED if symptoms persists.

## 2024-05-27 ENCOUNTER — Ambulatory Visit: Payer: Self-pay | Admitting: Family Medicine

## 2024-05-27 LAB — BASIC METABOLIC PANEL WITH GFR
BUN/Creatinine Ratio: 19 (ref 9–23)
BUN: 17 mg/dL (ref 6–20)
CO2: 24 mmol/L (ref 20–29)
Calcium: 10 mg/dL (ref 8.7–10.2)
Chloride: 99 mmol/L (ref 96–106)
Creatinine, Ser: 0.89 mg/dL (ref 0.57–1.00)
Glucose: 103 mg/dL — ABNORMAL HIGH (ref 70–99)
Potassium: 4.1 mmol/L (ref 3.5–5.2)
Sodium: 138 mmol/L (ref 134–144)
eGFR: 89 mL/min/{1.73_m2}

## 2024-05-31 ENCOUNTER — Telehealth: Payer: Self-pay | Admitting: Family Medicine

## 2024-05-31 ENCOUNTER — Ambulatory Visit: Payer: Self-pay | Admitting: Family Medicine

## 2024-05-31 NOTE — Telephone Encounter (Signed)
 Patient is scheduled for CT which is 02/09 @10 :30. Patient is aware of this appointment.

## 2024-06-02 ENCOUNTER — Other Ambulatory Visit: Payer: Self-pay | Admitting: Family Medicine

## 2024-06-02 MED ORDER — AMLODIPINE BESYLATE 10 MG PO TABS: 10.0000 mg | ORAL_TABLET | Freq: Every day | ORAL | 1 refills | Status: AC

## 2024-06-02 MED ORDER — HYDROCHLOROTHIAZIDE 25 MG PO TABS: 25.0000 mg | ORAL_TABLET | Freq: Every day | ORAL | 1 refills | Status: AC

## 2024-06-02 MED ORDER — OLMESARTAN MEDOXOMIL 40 MG PO TABS: 40.0000 mg | ORAL_TABLET | Freq: Every day | ORAL | 1 refills | Status: AC

## 2024-06-02 MED ORDER — OLMESARTAN MEDOXOMIL 40 MG PO TABS: 40.0000 mg | ORAL_TABLET | Freq: Every day | ORAL | 0 refills | Status: DC

## 2024-06-06 ENCOUNTER — Ambulatory Visit (HOSPITAL_COMMUNITY)

## 2024-06-23 ENCOUNTER — Ambulatory Visit: Payer: Self-pay | Admitting: Primary Care
